# Patient Record
Sex: Female | Born: 1980 | Race: Black or African American | Hispanic: No | Marital: Single | Smoking: Never smoker
Health system: Southern US, Community
[De-identification: ages and names within clinical notes are randomized; demographics above are authoritative.]

---

## 2018-02-11 ENCOUNTER — Emergency Department (HOSPITAL_COMMUNITY): Payer: Self-pay

## 2018-02-11 ENCOUNTER — Observation Stay (HOSPITAL_COMMUNITY)
Admission: EM | Admit: 2018-02-11 | Discharge: 2018-02-12 | Disposition: A | Discharge status: Home / Self Care | Payer: Self-pay | Attending: Orthopaedic Surgery | Admitting: Orthopaedic Surgery

## 2018-02-11 ENCOUNTER — Encounter (HOSPITAL_COMMUNITY): Payer: Self-pay

## 2018-02-11 ENCOUNTER — Other Ambulatory Visit: Payer: Self-pay

## 2018-02-11 ENCOUNTER — Emergency Department (HOSPITAL_COMMUNITY): Payer: Self-pay | Admitting: Certified Registered Nurse Anesthetist

## 2018-02-11 ENCOUNTER — Encounter (HOSPITAL_COMMUNITY)
Admission: EM | Disposition: A | Discharge status: Home / Self Care | Payer: Self-pay | Source: Home / Self Care | Attending: Emergency Medicine

## 2018-02-11 DIAGNOSIS — L089 Local infection of the skin and subcutaneous tissue, unspecified: Secondary | ICD-10-CM

## 2018-02-11 DIAGNOSIS — L03012 Cellulitis of left finger: Secondary | ICD-10-CM | POA: Insufficient documentation

## 2018-02-11 DIAGNOSIS — S61252A Open bite of right middle finger without damage to nail, initial encounter: Secondary | ICD-10-CM | POA: Insufficient documentation

## 2018-02-11 DIAGNOSIS — M6510 Other infective (teno)synovitis, unspecified site: Secondary | ICD-10-CM | POA: Diagnosis present

## 2018-02-11 DIAGNOSIS — L03011 Cellulitis of right finger: Secondary | ICD-10-CM | POA: Insufficient documentation

## 2018-02-11 DIAGNOSIS — M65142 Other infective (teno)synovitis, left hand: Principal | ICD-10-CM | POA: Insufficient documentation

## 2018-02-11 DIAGNOSIS — W503XXA Accidental bite by another person, initial encounter: Secondary | ICD-10-CM | POA: Insufficient documentation

## 2018-02-11 DIAGNOSIS — A499 Bacterial infection, unspecified: Secondary | ICD-10-CM | POA: Diagnosis present

## 2018-02-11 DIAGNOSIS — S61253A Open bite of left middle finger without damage to nail, initial encounter: Secondary | ICD-10-CM | POA: Insufficient documentation

## 2018-02-11 HISTORY — PX: I & D EXTREMITY: SHX5045

## 2018-02-11 LAB — BASIC METABOLIC PANEL
ANION GAP: 10 (ref 5–15)
BUN: 10 mg/dL (ref 6–20)
CALCIUM: 8.8 mg/dL — AB (ref 8.9–10.3)
CO2: 24 mmol/L (ref 22–32)
CREATININE: 0.7 mg/dL (ref 0.44–1.00)
Chloride: 106 mmol/L (ref 98–111)
GFR calc Af Amer: >60 mL/min (ref >60)
GLUCOSE: 107 mg/dL — AB (ref 70–99)
Potassium: 3.4 mmol/L — ABNORMAL LOW (ref 3.5–5.1)
Sodium: 140 mmol/L (ref 135–145)

## 2018-02-11 LAB — CBC WITH DIFFERENTIAL/PLATELET
BASOS PCT: 0 %
Basophils Absolute: 0 10*3/uL (ref 0.0–0.1)
EOS PCT: 1 %
Eosinophils Absolute: 0.1 10*3/uL (ref 0.0–0.7)
HEMATOCRIT: 37.2 % (ref 36.0–46.0)
HEMOGLOBIN: 12.4 g/dL (ref 12.0–15.0)
LYMPHS PCT: 34 %
Lymphs Abs: 3 10*3/uL (ref 0.7–4.0)
MCH: 28.6 pg (ref 26.0–34.0)
MCHC: 33.3 g/dL (ref 30.0–36.0)
MCV: 85.9 fL (ref 78.0–100.0)
MONOS PCT: 5 %
Monocytes Absolute: 0.4 10*3/uL (ref 0.1–1.0)
NEUTROS PCT: 60 %
Neutro Abs: 5.3 10*3/uL (ref 1.7–7.7)
Platelets: 292 10*3/uL (ref 150–400)
RBC: 4.33 MIL/uL (ref 3.87–5.11)
RDW: 12.9 % (ref 11.5–15.5)
WBC: 8.8 10*3/uL (ref 4.0–10.5)

## 2018-02-11 SURGERY — IRRIGATION AND DEBRIDEMENT EXTREMITY
Anesthesia: General | Site: Hand | Laterality: Bilateral

## 2018-02-11 MED ORDER — LIDOCAINE 2% (20 MG/ML) 5 ML SYRINGE
INTRAMUSCULAR | Status: AC
  Filled 2018-02-11: qty 5

## 2018-02-11 MED ORDER — FENTANYL CITRATE (PF) 250 MCG/5ML IJ SOLN
INTRAMUSCULAR | Status: DC | PRN
  Administered 2018-02-11 (×4): 50 ug via INTRAVENOUS

## 2018-02-11 MED ORDER — SODIUM CHLORIDE 0.9 % IV SOLN
INTRAVENOUS | Status: DC
  Administered 2018-02-11 – 2018-02-12 (×2): via INTRAVENOUS

## 2018-02-11 MED ORDER — DIPHENHYDRAMINE HCL 12.5 MG/5ML PO ELIX: 12.5000 mg | ORAL_SOLUTION | ORAL | Status: DC | PRN

## 2018-02-11 MED ORDER — SODIUM CHLORIDE 0.9 % IV SOLN
Freq: Once | INTRAVENOUS | Status: AC
  Administered 2018-02-11: 03:00:00 via INTRAVENOUS

## 2018-02-11 MED ORDER — OXYCODONE HCL 5 MG PO TABS
5.0000 mg | ORAL_TABLET | ORAL | Status: DC | PRN
  Filled 2018-02-11 (×3): qty 2
  Filled 2018-02-11: qty 1

## 2018-02-11 MED ORDER — MORPHINE SULFATE (PF) 4 MG/ML IV SOLN
6.0000 mg | Freq: Once | INTRAVENOUS | Status: AC
  Administered 2018-02-11: 6 mg via INTRAVENOUS
  Filled 2018-02-11: qty 2

## 2018-02-11 MED ORDER — LACTATED RINGERS IV SOLN
INTRAVENOUS | Status: DC | PRN
  Administered 2018-02-11: 09:00:00 via INTRAVENOUS

## 2018-02-11 MED ORDER — ONDANSETRON HCL 4 MG/2ML IJ SOLN
4.0000 mg | Freq: Four times a day (QID) | INTRAMUSCULAR | Status: DC | PRN
  Administered 2018-02-11: 4 mg via INTRAVENOUS
  Filled 2018-02-11: qty 2

## 2018-02-11 MED ORDER — DOCUSATE SODIUM 100 MG PO CAPS
100.0000 mg | ORAL_CAPSULE | Freq: Two times a day (BID) | ORAL | Status: DC
  Administered 2018-02-11 – 2018-02-12 (×2): 100 mg via ORAL
  Filled 2018-02-11 (×2): qty 1

## 2018-02-11 MED ORDER — VANCOMYCIN HCL IN DEXTROSE 1-5 GM/200ML-% IV SOLN
1000.0000 mg | Freq: Two times a day (BID) | INTRAVENOUS | Status: AC
  Administered 2018-02-11: 1000 mg via INTRAVENOUS
  Filled 2018-02-11: qty 200

## 2018-02-11 MED ORDER — METOCLOPRAMIDE HCL 5 MG PO TABS: 5.0000 mg | ORAL_TABLET | Freq: Three times a day (TID) | ORAL | Status: DC | PRN

## 2018-02-11 MED ORDER — METOCLOPRAMIDE HCL 5 MG/ML IJ SOLN: 5.0000 mg | Freq: Three times a day (TID) | INTRAMUSCULAR | Status: DC | PRN

## 2018-02-11 MED ORDER — GLYCOPYRROLATE PF 0.2 MG/ML IJ SOSY
PREFILLED_SYRINGE | INTRAMUSCULAR | Status: AC
  Filled 2018-02-11: qty 1

## 2018-02-11 MED ORDER — SODIUM CHLORIDE 0.9 % IV SOLN
3.0000 g | Freq: Four times a day (QID) | INTRAVENOUS | Status: DC
  Administered 2018-02-11 – 2018-02-12 (×4): 3 g via INTRAVENOUS
  Filled 2018-02-11 (×5): qty 3

## 2018-02-11 MED ORDER — PHENYLEPHRINE 40 MCG/ML (10ML) SYRINGE FOR IV PUSH (FOR BLOOD PRESSURE SUPPORT)
PREFILLED_SYRINGE | INTRAVENOUS | Status: DC | PRN
  Administered 2018-02-11: 80 ug via INTRAVENOUS

## 2018-02-11 MED ORDER — PROPOFOL 10 MG/ML IV BOLUS
INTRAVENOUS | Status: DC | PRN
  Administered 2018-02-11: 150 mg via INTRAVENOUS

## 2018-02-11 MED ORDER — ONDANSETRON HCL 4 MG/2ML IJ SOLN
4.0000 mg | Freq: Once | INTRAMUSCULAR | Status: AC
  Administered 2018-02-11: 4 mg via INTRAVENOUS
  Filled 2018-02-11: qty 2

## 2018-02-11 MED ORDER — VANCOMYCIN HCL 1000 MG IV SOLR
INTRAVENOUS | Status: DC | PRN
  Administered 2018-02-11: 1000 mg via INTRAVENOUS

## 2018-02-11 MED ORDER — DEXAMETHASONE SODIUM PHOSPHATE 10 MG/ML IJ SOLN
INTRAMUSCULAR | Status: DC | PRN
  Administered 2018-02-11: 10 mg via INTRAVENOUS

## 2018-02-11 MED ORDER — GLYCOPYRROLATE PF 0.2 MG/ML IJ SOSY
PREFILLED_SYRINGE | INTRAMUSCULAR | Status: DC | PRN
  Administered 2018-02-11: .1 mg via INTRAVENOUS

## 2018-02-11 MED ORDER — METHOCARBAMOL 500 MG PO TABS
500.0000 mg | ORAL_TABLET | Freq: Four times a day (QID) | ORAL | Status: DC | PRN
  Administered 2018-02-11 – 2018-02-12 (×2): 500 mg via ORAL
  Filled 2018-02-11 (×2): qty 1

## 2018-02-11 MED ORDER — HYDROMORPHONE HCL 1 MG/ML IJ SOLN
0.2500 mg | INTRAMUSCULAR | Status: DC | PRN
  Administered 2018-02-11 (×4): 0.5 mg via INTRAVENOUS

## 2018-02-11 MED ORDER — SUCCINYLCHOLINE CHLORIDE 200 MG/10ML IV SOSY
PREFILLED_SYRINGE | INTRAVENOUS | Status: DC | PRN
  Administered 2018-02-11: 100 mg via INTRAVENOUS

## 2018-02-11 MED ORDER — ONDANSETRON HCL 4 MG/2ML IJ SOLN
INTRAMUSCULAR | Status: DC | PRN
  Administered 2018-02-11: 4 mg via INTRAVENOUS

## 2018-02-11 MED ORDER — HYDROMORPHONE HCL 1 MG/ML IJ SOLN
INTRAMUSCULAR | Status: AC
  Filled 2018-02-11: qty 2

## 2018-02-11 MED ORDER — DEXAMETHASONE SODIUM PHOSPHATE 10 MG/ML IJ SOLN
INTRAMUSCULAR | Status: AC
  Filled 2018-02-11: qty 1

## 2018-02-11 MED ORDER — 0.9 % SODIUM CHLORIDE (POUR BTL) OPTIME
TOPICAL | Status: DC | PRN
  Administered 2018-02-11: 1000 mL

## 2018-02-11 MED ORDER — ONDANSETRON HCL 4 MG PO TABS: 4.0000 mg | ORAL_TABLET | Freq: Four times a day (QID) | ORAL | Status: DC | PRN

## 2018-02-11 MED ORDER — PROPOFOL 10 MG/ML IV BOLUS
INTRAVENOUS | Status: AC
  Filled 2018-02-11: qty 20

## 2018-02-11 MED ORDER — ACETAMINOPHEN 325 MG PO TABS: 325.0000 mg | ORAL_TABLET | Freq: Four times a day (QID) | ORAL | Status: DC | PRN

## 2018-02-11 MED ORDER — FENTANYL CITRATE (PF) 100 MCG/2ML IJ SOLN
INTRAMUSCULAR | Status: AC
  Filled 2018-02-11: qty 2

## 2018-02-11 MED ORDER — SODIUM CHLORIDE 0.9 % IV SOLN
3.0000 g | Freq: Once | INTRAVENOUS | Status: AC
  Administered 2018-02-11: 3 g via INTRAVENOUS
  Filled 2018-02-11: qty 3

## 2018-02-11 MED ORDER — PHENYLEPHRINE 40 MCG/ML (10ML) SYRINGE FOR IV PUSH (FOR BLOOD PRESSURE SUPPORT)
PREFILLED_SYRINGE | INTRAVENOUS | Status: AC
  Filled 2018-02-11: qty 10

## 2018-02-11 MED ORDER — PROMETHAZINE HCL 25 MG/ML IJ SOLN: 6.2500 mg | INTRAMUSCULAR | Status: DC | PRN

## 2018-02-11 MED ORDER — HYDROMORPHONE HCL 1 MG/ML IJ SOLN
0.5000 mg | INTRAMUSCULAR | Status: DC | PRN
  Administered 2018-02-11: 0.5 mg via INTRAVENOUS
  Filled 2018-02-11: qty 1

## 2018-02-11 MED ORDER — OXYCODONE HCL 5 MG PO TABS
10.0000 mg | ORAL_TABLET | ORAL | Status: DC | PRN
  Administered 2018-02-11: 10 mg via ORAL
  Administered 2018-02-11: 15 mg via ORAL
  Administered 2018-02-11: 10 mg via ORAL
  Administered 2018-02-12 (×2): 15 mg via ORAL
  Filled 2018-02-11 (×2): qty 3

## 2018-02-11 MED ORDER — METHOCARBAMOL 1000 MG/10ML IJ SOLN
500.0000 mg | Freq: Four times a day (QID) | INTRAVENOUS | Status: DC | PRN
  Filled 2018-02-11: qty 5

## 2018-02-11 MED ORDER — ONDANSETRON HCL 4 MG/2ML IJ SOLN
INTRAMUSCULAR | Status: AC
  Filled 2018-02-11: qty 2

## 2018-02-11 MED ORDER — ROCURONIUM BROMIDE 100 MG/10ML IV SOLN
INTRAVENOUS | Status: AC
  Filled 2018-02-11: qty 1

## 2018-02-11 MED ORDER — LIDOCAINE 2% (20 MG/ML) 5 ML SYRINGE
INTRAMUSCULAR | Status: DC | PRN
  Administered 2018-02-11: 60 mg via INTRAVENOUS

## 2018-02-11 MED ORDER — VANCOMYCIN HCL IN DEXTROSE 1-5 GM/200ML-% IV SOLN
INTRAVENOUS | Status: AC
  Filled 2018-02-11: qty 200

## 2018-02-11 SURGICAL SUPPLY — 32 items
BAG ZIPLOCK 12X15 (MISCELLANEOUS) ×3 IMPLANT
BANDAGE ESMARK 6X9 LF (GAUZE/BANDAGES/DRESSINGS) IMPLANT
BNDG COHESIVE 1X5 TAN STRL LF (GAUZE/BANDAGES/DRESSINGS) ×6 IMPLANT
BNDG CONFORM 2 STRL LF (GAUZE/BANDAGES/DRESSINGS) ×6 IMPLANT
BNDG ESMARK 6X9 LF (GAUZE/BANDAGES/DRESSINGS)
BNDG GAUZE ELAST 4 BULKY (GAUZE/BANDAGES/DRESSINGS) IMPLANT
COVER SURGICAL LIGHT HANDLE (MISCELLANEOUS) ×3 IMPLANT
CUFF TOURN SGL QUICK 18 (TOURNIQUET CUFF) IMPLANT
CUFF TOURN SGL QUICK 24 (TOURNIQUET CUFF)
CUFF TOURN SGL QUICK 34 (TOURNIQUET CUFF)
CUFF TRNQT CYL 24X4X40X1 (TOURNIQUET CUFF) IMPLANT
CUFF TRNQT CYL 34X4X40X1 (TOURNIQUET CUFF) IMPLANT
DRAIN PENROSE 18X1/2 LTX STRL (DRAIN) IMPLANT
DRSG PAD ABDOMINAL 8X10 ST (GAUZE/BANDAGES/DRESSINGS) IMPLANT
DURAPREP 26ML APPLICATOR (WOUND CARE) ×6 IMPLANT
ELECT REM PT RETURN 15FT ADLT (MISCELLANEOUS) ×3 IMPLANT
GAUZE SPONGE 4X4 12PLY STRL (GAUZE/BANDAGES/DRESSINGS) ×3 IMPLANT
GAUZE XEROFORM 1X8 LF (GAUZE/BANDAGES/DRESSINGS) ×6 IMPLANT
GLOVE BIO SURGEON STRL SZ7.5 (GLOVE) ×3 IMPLANT
GLOVE BIOGEL PI IND STRL 8 (GLOVE) ×1 IMPLANT
GLOVE BIOGEL PI INDICATOR 8 (GLOVE) ×2
GLOVE ECLIPSE 8.0 STRL XLNG CF (GLOVE) ×3 IMPLANT
GOWN STRL REUS W/TWL XL LVL3 (GOWN DISPOSABLE) ×3 IMPLANT
HANDPIECE INTERPULSE COAX TIP (DISPOSABLE) ×2
KIT BASIN OR (CUSTOM PROCEDURE TRAY) IMPLANT
PACK ORTHO EXTREMITY (CUSTOM PROCEDURE TRAY) ×3 IMPLANT
PAD CAST 4YDX4 CTTN HI CHSV (CAST SUPPLIES) IMPLANT
PADDING CAST COTTON 4X4 STRL (CAST SUPPLIES)
POSITIONER SURGICAL ARM (MISCELLANEOUS) ×3 IMPLANT
SET HNDPC FAN SPRY TIP SCT (DISPOSABLE) ×1 IMPLANT
SYR CONTROL 10ML LL (SYRINGE) IMPLANT
TOWEL OR 17X26 10 PK STRL BLUE (TOWEL DISPOSABLE) ×6 IMPLANT
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
## 2018-02-11 NOTE — ED Notes (Addendum)
Spoke to OR. They are ready for patient. Patient transported to PACU. 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 Patient wiped down with Hcg wipes. OR checklist completed.
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
## 2018-02-11 NOTE — Anesthesia Procedure Notes (Signed)
Procedure Name: Intubation Date/Time: 02/11/2018 9:30 AM Performed by: Lorelee Market, CRNA Pre-anesthesia Checklist: Patient identified, Emergency Drugs available, Suction available, Patient being monitored and Timeout performed Patient Re-evaluated:Patient Re-evaluated prior to induction Oxygen Delivery Method: Circle system utilized Preoxygenation: Pre-oxygenation with 100% oxygen Induction Type: IV induction, Rapid sequence and Cricoid Pressure applied Grade View: Grade I Tube type: Oral Tube size: 7.0 mm Number of attempts: 1 Airway Equipment and Method: Stylet Placement Confirmation: ETT inserted through vocal cords under direct vision,  positive ETCO2 and breath sounds checked- equal and bilateral Secured at: 22 cm Tube secured with: Tape Dental Injury: Teeth and Oropharynx as per pre-operative assessment
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
## 2018-02-11 NOTE — Brief Op Note (Signed)
02/11/2018  10:09 AM  PATIENT:  Marcia Soto  37 y.o. female  PRE-OPERATIVE DIAGNOSIS:  INFECTION BILATERAL MIDDLE FINGERS  POST-OPERATIVE DIAGNOSIS:  infected bilateral  middle fingers  PROCEDURE:  Procedure(s): IRRIGATION AND DEBRIDEMENT BILATERAL /MIDDLE FINGERS (Bilateral)  SURGEON:  Surgeon(s) and Role:    Kathryne Hitch, MD - Primary  ANESTHESIA:   general  EBL:  5 mL   COUNTS:  YES  TOURNIQUET:  * Missing tourniquet times found for documented tourniquets in log: [id number] *  DICTATION: .Other Dictation: Dictation Number [id number]  PLAN OF CARE: Admit for overnight observation  PATIENT DISPOSITION:  PACU - hemodynamically stable.   Delay start of Pharmacological VTE agent (>24hrs) due to surgical blood loss or risk of bleeding: no

## 2018-02-11 NOTE — Transfer of Care (Signed)
Immediate Anesthesia Transfer of Care Note  Patient: Marcia Soto  Procedure(s) Performed: IRRIGATION AND DEBRIDEMENT BILATERAL /MIDDLE FINGERS (Bilateral Hand)  Patient Location: PACU  Anesthesia Type:General  Level of Consciousness: awake, alert , oriented and patient cooperative  Airway & Oxygen Therapy: Patient Spontanous Breathing and Patient connected to face mask oxygen  Post-op Assessment: Report given to RN, Post -op Vital signs reviewed and stable and Patient moving all extremities  Post vital signs: Reviewed and stable  Last Vitals:  Vitals Value Taken Time  BP 145/100 02/11/2018 10:15 AM  Temp    Pulse 83 02/11/2018 10:17 AM  Resp 5 02/11/2018 10:17 AM  SpO2 100 % 02/11/2018 10:17 AM  Vitals shown include unvalidated device data.  Last Pain:  Vitals:   02/11/18 0741  TempSrc:   PainSc: 0-No pain         Complications: No apparent anesthesia complications

## 2018-02-11 NOTE — Anesthesia Postprocedure Evaluation (Signed)
Anesthesia Post Note  Patient: Marcia Soto  Procedure(s) Performed: IRRIGATION AND DEBRIDEMENT BILATERAL /MIDDLE FINGERS (Bilateral Hand)     Patient location during evaluation: PACU Anesthesia Type: General Level of consciousness: awake and alert Pain management: pain level controlled Vital Signs Assessment: post-procedure vital signs reviewed and stable Respiratory status: spontaneous breathing, nonlabored ventilation, respiratory function stable and patient connected to nasal cannula oxygen Cardiovascular status: blood pressure returned to baseline and stable Postop Assessment: no apparent nausea or vomiting Anesthetic complications: no    Last Vitals:  Vitals:   02/11/18 1045 02/11/18 1113  BP: 129/90 132/90  Pulse: 86 66  Resp: 12 16  Temp: 36.7 C 36.4 C  SpO2: 94% 100%    Last Pain:  Vitals:   02/11/18 1113  TempSrc: Oral  PainSc:                  Kennieth Rad
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
## 2018-02-12 ENCOUNTER — Encounter (HOSPITAL_COMMUNITY): Payer: Self-pay | Admitting: Orthopaedic Surgery

## 2018-02-12 MED ORDER — DOXYCYCLINE HYCLATE 100 MG PO TABS: 100.0000 mg | ORAL_TABLET | Freq: Two times a day (BID) | ORAL | 0 refills | Status: DC

## 2018-02-12 MED ORDER — OXYCODONE HCL 5 MG PO TABS: 5.0000 mg | ORAL_TABLET | ORAL | 0 refills | Status: DC | PRN

## 2018-02-12 NOTE — Discharge Instructions (Signed)
Leave your current dressings on and in place for the next 2 days. Keep your dressings clean and dry. You can remove your dressings in 2 days (Thursday). In 2 days, you can place a small amount of Neosporin on your wounds daily and band-aids. Will need to be out of work thru next week.

## 2018-02-12 NOTE — Progress Notes (Signed)
Discharge paperwork discussed with pt at the bedside.  She demonstrated understanding.  Pt escorted by wheelchair in stable condition to main lobby.
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
## 2018-02-12 NOTE — Plan of Care (Signed)
Pt alert and oriented, complaints of pain in fingers this am.  Plan to d/c home per MD order.

## 2018-02-12 NOTE — Progress Notes (Signed)
Patient ID: Marcia Soto, female   DOB: 1981/02/13, 37 y.o.   MRN: [id number] Tolerated surgery well yesterday.  Feels better overall, but left middle finger still painful.  Dressings clean and intact.  Can be discharged to home today on oral antibiotics.

## 2018-02-27 ENCOUNTER — Encounter (INDEPENDENT_AMBULATORY_CARE_PROVIDER_SITE_OTHER): Payer: Self-pay | Admitting: Physician Assistant

## 2018-02-27 ENCOUNTER — Ambulatory Visit (INDEPENDENT_AMBULATORY_CARE_PROVIDER_SITE_OTHER): Payer: Self-pay | Admitting: Physician Assistant

## 2018-02-27 DIAGNOSIS — W503XXD Accidental bite by another person, subsequent encounter: Secondary | ICD-10-CM

## 2018-02-27 DIAGNOSIS — M6510 Other infective (teno)synovitis, unspecified site: Secondary | ICD-10-CM
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
## 2018-03-13 ENCOUNTER — Ambulatory Visit (INDEPENDENT_AMBULATORY_CARE_PROVIDER_SITE_OTHER): Payer: Self-pay | Admitting: Physician Assistant

## 2018-03-13 ENCOUNTER — Encounter (INDEPENDENT_AMBULATORY_CARE_PROVIDER_SITE_OTHER): Payer: Self-pay | Admitting: Physician Assistant

## 2018-03-13 VITALS — Ht 60.0 in | Wt 147.0 lb

## 2018-03-13 DIAGNOSIS — M6510 Other infective (teno)synovitis, unspecified site: Secondary | ICD-10-CM

## 2018-03-13 DIAGNOSIS — W503XXD Accidental bite by another person, subsequent encounter: Secondary | ICD-10-CM

## 2018-03-13 NOTE — Progress Notes (Signed)
HPI: Marcia Soto returns today follow-up of her both long finger infections which she underwent I&D's for on 02/11/2018.  She states the right hand is doing well.  The left hand long finger she still has stiffness soreness and some swelling.  She has had no fevers chills.  She is working on range of motion on her own.  Physical exam: Right hand middle finger surgical incision sites healing well no signs of infection she has full range of motion of the right long finger.  Left long finger she has decreased motion through the PIP joint and swelling about the PIP joint.  Surgical incision is healed well.  No signs of gross infection.  Positive edema of the left long finger but no erythema.  Impression: Status post irrigation debridement bilateral middle finger infections  Plan: Patient would like to be referred to therapy for range of motion of the left hand particularly.  She is still working on getting some type of insurance coverage.  She is given Pennsaid samples that she will apply to the left middle finger 3 times daily.  Follow-up with [place] in 1 month.

## 2018-04-15 ENCOUNTER — Ambulatory Visit (INDEPENDENT_AMBULATORY_CARE_PROVIDER_SITE_OTHER): Payer: Self-pay | Admitting: Physician Assistant

## 2018-07-18 ENCOUNTER — Emergency Department (HOSPITAL_COMMUNITY): Payer: Self-pay

## 2018-07-18 ENCOUNTER — Emergency Department (HOSPITAL_COMMUNITY)
Admission: EM | Admit: 2018-07-18 | Discharge: 2018-07-18 | Disposition: A | Discharge status: Home / Self Care | Payer: Self-pay | Attending: Emergency Medicine | Admitting: Emergency Medicine

## 2018-07-18 ENCOUNTER — Encounter (HOSPITAL_COMMUNITY): Payer: Self-pay | Admitting: Emergency Medicine

## 2018-07-18 DIAGNOSIS — M545 Low back pain, unspecified: Secondary | ICD-10-CM

## 2018-07-18 DIAGNOSIS — R300 Dysuria: Secondary | ICD-10-CM | POA: Insufficient documentation

## 2018-07-18 LAB — URINALYSIS, ROUTINE W REFLEX MICROSCOPIC
Bilirubin Urine: NEGATIVE
Glucose, UA: NEGATIVE mg/dL
HGB URINE DIPSTICK: NEGATIVE
Ketones, ur: NEGATIVE mg/dL
Leukocytes, UA: NEGATIVE
Nitrite: NEGATIVE
Protein, ur: NEGATIVE mg/dL
Specific Gravity, Urine: 1.035 — ABNORMAL HIGH (ref 1.005–1.030)
pH: 5 (ref 5.0–8.0)

## 2018-07-18 MED ORDER — METHOCARBAMOL 500 MG PO TABS: 500.0000 mg | ORAL_TABLET | Freq: Two times a day (BID) | ORAL | 0 refills | Status: AC

## 2018-07-18 MED ORDER — PREDNISONE 20 MG PO TABS: 40.0000 mg | ORAL_TABLET | Freq: Every day | ORAL | 0 refills | Status: AC

## 2018-07-18 NOTE — Discharge Instructions (Signed)
I have prescribed muscle relaxers for your pain, please do not drink or drive while taking this medications as they can make you drowsy.  I have also prescribed steroids, be advised this medication can cause insomnia, appetite changes.  These follow-up with PCP in 1 week for reevaluation of your symptoms.  You experience any bowel or bladder incontinence, fever, worsening in your symptoms please return to the ED. ° °
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
## 2018-07-18 NOTE — ED Notes (Signed)
Pt verbalized understanding of discharge paperwork, prescriptions and follow-up care 

## 2018-07-18 NOTE — ED Triage Notes (Signed)
Pt reports having lower right sided back pain that radiates across the mid back x1 week. Denies any urinary symptoms.

## 2018-07-19 LAB — URINE CULTURE: Culture: NO GROWTH

## 2018-08-14 ENCOUNTER — Encounter (HOSPITAL_COMMUNITY): Payer: Self-pay | Admitting: *Deleted

## 2018-08-14 ENCOUNTER — Emergency Department (HOSPITAL_COMMUNITY): Payer: Medicaid Other

## 2018-08-14 ENCOUNTER — Emergency Department (HOSPITAL_COMMUNITY)
Admission: EM | Admit: 2018-08-14 | Discharge: 2018-08-14 | Disposition: A | Discharge status: Home / Self Care | Payer: Medicaid Other | Attending: Emergency Medicine | Admitting: Emergency Medicine

## 2018-08-14 ENCOUNTER — Other Ambulatory Visit: Payer: Self-pay

## 2018-08-14 DIAGNOSIS — R05 Cough: Secondary | ICD-10-CM

## 2018-08-14 DIAGNOSIS — J101 Influenza due to other identified influenza virus with other respiratory manifestations: Secondary | ICD-10-CM

## 2018-08-14 DIAGNOSIS — J111 Influenza due to unidentified influenza virus with other respiratory manifestations: Secondary | ICD-10-CM | POA: Insufficient documentation

## 2018-08-14 DIAGNOSIS — Z79899 Other long term (current) drug therapy: Secondary | ICD-10-CM | POA: Insufficient documentation

## 2018-08-14 DIAGNOSIS — R059 Cough, unspecified: Secondary | ICD-10-CM

## 2018-08-14 LAB — CBC WITH DIFFERENTIAL/PLATELET
Abs Immature Granulocytes: 0.02 10*3/uL (ref 0.00–0.07)
Basophils Absolute: 0 10*3/uL (ref 0.0–0.1)
Basophils Relative: 0 %
EOS PCT: 0 %
Eosinophils Absolute: 0 10*3/uL (ref 0.0–0.5)
HCT: 39.9 % (ref 36.0–46.0)
Hemoglobin: 12.6 g/dL (ref 12.0–15.0)
Immature Granulocytes: 0 %
LYMPHS ABS: 0.6 10*3/uL — AB (ref 0.7–4.0)
LYMPHS PCT: 7 %
MCH: 27.6 pg (ref 26.0–34.0)
MCHC: 31.6 g/dL (ref 30.0–36.0)
MCV: 87.3 fL (ref 80.0–100.0)
Monocytes Absolute: 0.4 10*3/uL (ref 0.1–1.0)
Monocytes Relative: 5 %
Neutro Abs: 6.9 10*3/uL (ref 1.7–7.7)
Neutrophils Relative %: 88 %
Platelets: 284 10*3/uL (ref 150–400)
RBC: 4.57 MIL/uL (ref 3.87–5.11)
RDW: 12.7 % (ref 11.5–15.5)
WBC: 8 10*3/uL (ref 4.0–10.5)
nRBC: 0 % (ref 0.0–0.2)

## 2018-08-14 LAB — BASIC METABOLIC PANEL
Anion gap: 9 (ref 5–15)
BUN: 10 mg/dL (ref 6–20)
CO2: 23 mmol/L (ref 22–32)
CREATININE: 0.81 mg/dL (ref 0.44–1.00)
Calcium: 9.2 mg/dL (ref 8.9–10.3)
Chloride: 104 mmol/L (ref 98–111)
GFR calc non Af Amer: >60 mL/min (ref >60)
Glucose, Bld: 107 mg/dL — ABNORMAL HIGH (ref 70–99)
Potassium: 3.6 mmol/L (ref 3.5–5.1)
Sodium: 136 mmol/L (ref 135–145)

## 2018-08-14 LAB — INFLUENZA PANEL BY PCR (TYPE A & B)
Influenza A By PCR: POSITIVE — AB
Influenza B By PCR: NEGATIVE

## 2018-08-14 MED ORDER — IBUPROFEN 400 MG PO TABS: 600.0000 mg | ORAL_TABLET | Freq: Once | ORAL | Status: DC

## 2018-08-14 MED ORDER — OSELTAMIVIR PHOSPHATE 75 MG PO CAPS: 75.0000 mg | ORAL_CAPSULE | Freq: Two times a day (BID) | ORAL | 0 refills | Status: AC

## 2018-08-14 MED ORDER — SODIUM CHLORIDE 0.9 % IV BOLUS
2000.0000 mL | Freq: Once | INTRAVENOUS | Status: AC
  Administered 2018-08-14: 2000 mL via INTRAVENOUS

## 2018-08-14 MED ORDER — ACETAMINOPHEN 325 MG PO TABS
650.0000 mg | ORAL_TABLET | Freq: Once | ORAL | Status: AC
  Administered 2018-08-14: 650 mg via ORAL
  Filled 2018-08-14: qty 2

## 2018-08-14 MED ORDER — KETOROLAC TROMETHAMINE 30 MG/ML IJ SOLN
30.0000 mg | Freq: Once | INTRAMUSCULAR | Status: AC
  Administered 2018-08-14: 30 mg via INTRAVENOUS
  Filled 2018-08-14: qty 1

## 2018-08-14 NOTE — Discharge Instructions (Addendum)
Please take medication as prescribed for 5 days; this should lessen your symptoms by up to 1 day Continue taking Ibuprofen and Tylenol for symptom relief and fever reducer  Drink plenty of fluids to stay hydrated  Follow up with your PCP in 1 week once you feel better Return to the ED for any worsening symptoms, fever, chills, dizziness, lightheadedness.

## 2018-08-14 NOTE — ED Notes (Signed)
Patient verbalizes understanding of discharge instructions. Opportunity for questioning and answers were provided. Armband removed by staff, pt discharged from ED.  

## 2018-08-14 NOTE — ED Triage Notes (Signed)
Pt in reports vomiting x 3-4 times in the last 24 hours, pt reports fever & chills, sore throat, cough, body aches, p t HR 140 in triage, pt ambulatory, A&O x4
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
## 2020-01-19 ENCOUNTER — Other Ambulatory Visit: Payer: Self-pay

## 2020-01-19 ENCOUNTER — Encounter (HOSPITAL_COMMUNITY): Payer: Self-pay | Admitting: Emergency Medicine

## 2020-01-19 ENCOUNTER — Emergency Department (HOSPITAL_COMMUNITY): Payer: BC Managed Care – PPO

## 2020-01-19 ENCOUNTER — Emergency Department (HOSPITAL_COMMUNITY)
Admission: EM | Admit: 2020-01-19 | Discharge: 2020-01-19 | Disposition: A | Discharge status: Home / Self Care | Payer: BC Managed Care – PPO | Attending: Emergency Medicine | Admitting: Emergency Medicine

## 2020-01-19 DIAGNOSIS — M79644 Pain in right finger(s): Secondary | ICD-10-CM | POA: Diagnosis present

## 2020-01-19 DIAGNOSIS — S60942A Unspecified superficial injury of right middle finger, initial encounter: Secondary | ICD-10-CM | POA: Insufficient documentation

## 2020-01-19 DIAGNOSIS — Y990 Civilian activity done for income or pay: Secondary | ICD-10-CM | POA: Diagnosis not present

## 2020-01-19 DIAGNOSIS — Y9389 Activity, other specified: Secondary | ICD-10-CM | POA: Diagnosis not present

## 2020-01-19 DIAGNOSIS — Y9289 Other specified places as the place of occurrence of the external cause: Secondary | ICD-10-CM | POA: Insufficient documentation

## 2020-01-19 DIAGNOSIS — S6991XA Unspecified injury of right wrist, hand and finger(s), initial encounter: Secondary | ICD-10-CM

## 2020-01-19 DIAGNOSIS — Y999 Unspecified external cause status: Secondary | ICD-10-CM | POA: Insufficient documentation

## 2020-01-19 DIAGNOSIS — W499XXA Exposure to other inanimate mechanical forces, initial encounter: Secondary | ICD-10-CM | POA: Diagnosis not present

## 2020-01-19 MED ORDER — AMOXICILLIN-POT CLAVULANATE 875-125 MG PO TABS
1.0000 | ORAL_TABLET | Freq: Once | ORAL | Status: AC
  Administered 2020-01-19: 1 via ORAL
  Filled 2020-01-19: qty 1

## 2020-01-19 MED ORDER — NAPROXEN 500 MG PO TABS: 500.0000 mg | ORAL_TABLET | Freq: Two times a day (BID) | ORAL | 0 refills | Status: AC

## 2020-01-19 MED ORDER — AMOXICILLIN-POT CLAVULANATE 875-125 MG PO TABS: 1.0000 | ORAL_TABLET | Freq: Two times a day (BID) | ORAL | 0 refills | Status: AC

## 2020-01-19 NOTE — ED Triage Notes (Signed)
Patient reports injury to right middle finger 4 days ago presents with redness/swelling , no drainage .

## 2020-01-19 NOTE — Progress Notes (Signed)
Orthopedic Tech Progress Note Patient Details:  Marcia Soto 1981/01/09 [id number]  Ortho Devices Type of Ortho Device: Finger splint Ortho Device/Splint Location: RUE Ortho Device/Splint Interventions: Ordered, Application   Post Interventions Patient Tolerated: Well Instructions Provided: Care of device   Donald Pore 01/19/2020, 9:44 AM
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
## 2020-01-19 NOTE — ED Notes (Signed)
Called ortho tech 

## 2020-01-19 NOTE — Discharge Instructions (Signed)
As discussed, your pain is likely due to inflammation of the finger in the area your injured a few days ago.  However, it is important you monitor your condition carefully, and do not hesitate to return here.  Otherwise, please follow-up with your physician.

## 2020-01-26 ENCOUNTER — Encounter: Payer: Self-pay | Admitting: Orthopaedic Surgery

## 2020-01-26 ENCOUNTER — Ambulatory Visit (INDEPENDENT_AMBULATORY_CARE_PROVIDER_SITE_OTHER): Payer: BC Managed Care – PPO | Admitting: Orthopaedic Surgery

## 2020-01-26 DIAGNOSIS — M6510 Other infective (teno)synovitis, unspecified site: Secondary | ICD-10-CM
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
## 2020-04-10 ENCOUNTER — Emergency Department (HOSPITAL_BASED_OUTPATIENT_CLINIC_OR_DEPARTMENT_OTHER)
Admission: EM | Admit: 2020-04-10 | Discharge: 2020-04-11 | Disposition: A | Discharge status: Home / Self Care | Payer: BC Managed Care – PPO | Attending: Emergency Medicine | Admitting: Emergency Medicine

## 2020-04-10 ENCOUNTER — Encounter (HOSPITAL_BASED_OUTPATIENT_CLINIC_OR_DEPARTMENT_OTHER): Payer: Self-pay | Admitting: Emergency Medicine

## 2020-04-10 ENCOUNTER — Other Ambulatory Visit: Payer: Self-pay

## 2020-04-10 DIAGNOSIS — S60032A Contusion of left middle finger without damage to nail, initial encounter: Secondary | ICD-10-CM | POA: Diagnosis not present

## 2020-04-10 DIAGNOSIS — R58 Hemorrhage, not elsewhere classified: Secondary | ICD-10-CM

## 2020-04-10 DIAGNOSIS — S0083XA Contusion of other part of head, initial encounter: Secondary | ICD-10-CM | POA: Diagnosis not present
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
## 2020-04-11 ENCOUNTER — Emergency Department (HOSPITAL_BASED_OUTPATIENT_CLINIC_OR_DEPARTMENT_OTHER): Payer: BC Managed Care – PPO

## 2020-04-11 ENCOUNTER — Encounter (HOSPITAL_BASED_OUTPATIENT_CLINIC_OR_DEPARTMENT_OTHER): Payer: Self-pay | Admitting: Emergency Medicine

## 2020-04-11 MED ORDER — ACETAMINOPHEN 500 MG PO TABS
1000.0000 mg | ORAL_TABLET | Freq: Once | ORAL | Status: AC
  Administered 2020-04-11: 1000 mg via ORAL
  Filled 2020-04-11: qty 2

## 2020-04-11 MED ORDER — IBUPROFEN 800 MG PO TABS
800.0000 mg | ORAL_TABLET | Freq: Once | ORAL | Status: AC
  Administered 2020-04-11: 800 mg via ORAL
  Filled 2020-04-11: qty 1
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
## 2020-11-20 ENCOUNTER — Encounter (HOSPITAL_COMMUNITY): Payer: Self-pay | Admitting: *Deleted

## 2020-11-20 ENCOUNTER — Other Ambulatory Visit: Payer: Self-pay

## 2020-11-20 ENCOUNTER — Ambulatory Visit (HOSPITAL_COMMUNITY)
Admission: EM | Admit: 2020-11-20 | Discharge: 2020-11-20 | Disposition: A | Discharge status: Home / Self Care | Payer: BC Managed Care – PPO | Attending: Emergency Medicine | Admitting: Emergency Medicine

## 2020-11-20 DIAGNOSIS — R051 Acute cough: Secondary | ICD-10-CM | POA: Diagnosis present

## 2020-11-20 DIAGNOSIS — J069 Acute upper respiratory infection, unspecified: Secondary | ICD-10-CM | POA: Diagnosis not present

## 2020-11-20 DIAGNOSIS — Z793 Long term (current) use of hormonal contraceptives: Secondary | ICD-10-CM | POA: Diagnosis not present

## 2020-11-20 DIAGNOSIS — U071 COVID-19: Secondary | ICD-10-CM | POA: Insufficient documentation

## 2020-11-20 LAB — SARS CORONAVIRUS 2 (TAT 6-24 HRS): SARS Coronavirus 2: POSITIVE — AB

## 2020-11-20 MED ORDER — PROMETHAZINE-DM 6.25-15 MG/5ML PO SYRP: 5.0000 mL | ORAL_SOLUTION | Freq: Four times a day (QID) | ORAL | 0 refills | Status: DC | PRN

## 2020-11-20 MED ORDER — BENZONATATE 100 MG PO CAPS: 200.0000 mg | ORAL_CAPSULE | Freq: Three times a day (TID) | ORAL | 0 refills | Status: DC | PRN
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
## 2020-11-20 NOTE — Discharge Instructions (Addendum)
We will contact you if your COVID test is positive.  Please quarantine while waiting on those results.   You can take Tylenol and/or Ibuprofen as needed for fever reduction and pain relief.   You can take the [facility] as needed for cough.  You can take the Promethazine DM at needed for cough at night.    For cough: honey 1/2 to 1 teaspoon (you can dilute the honey in water or another fluid).  You can also use guaifenesin and dextromethorphan for cough. You can use a humidifier for chest congestion and cough.  If you don't have a humidifier, you can sit in the bathroom with the hot shower running.     For sore throat: try warm salt water gargles, cepacol lozenges, throat spray, warm tea or water with lemon/honey, popsicles or ice, or OTC cold relief medicine for throat discomfort.    For congestion: take a daily anti-histamine like Zyrtec, Claritin, and a oral decongestant, such as pseudoephedrine.  You can also use Flonase 1-2 sprays in each nostril daily.    It is important to stay hydrated: drink plenty of fluids (water, gatorade/powerade/pedialyte, juices, or teas) to keep your throat moisturized and help further relieve irritation/discomfort.   Return or go to the Emergency Department if symptoms worsen or do not improve in the next few days.

## 2020-11-20 NOTE — ED Triage Notes (Signed)
Pt reports the cough started Tuesday morning.

## 2021-09-09 ENCOUNTER — Ambulatory Visit
Admission: EM | Admit: 2021-09-09 | Discharge: 2021-09-09 | Disposition: A | Discharge status: Home / Self Care | Payer: BC Managed Care – PPO | Attending: Physician Assistant | Admitting: Physician Assistant

## 2021-09-09 DIAGNOSIS — J209 Acute bronchitis, unspecified: Secondary | ICD-10-CM

## 2021-09-09 MED ORDER — PREDNISONE 20 MG PO TABS: 40.0000 mg | ORAL_TABLET | Freq: Every day | ORAL | 0 refills | Status: AC

## 2021-09-09 MED ORDER — AZITHROMYCIN 250 MG PO TABS: 250.0000 mg | ORAL_TABLET | Freq: Every day | ORAL | 0 refills | Status: DC

## 2021-09-09 NOTE — ED Triage Notes (Signed)
Pt c/o cough, sore throat,  ? ?Denies nasal congestion, ear ache, headache, nausea, vomiting, diarrhea, constipation  ? ?Onset ~ Saturday  ?
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
## 2022-07-24 ENCOUNTER — Ambulatory Visit
Admission: EM | Admit: 2022-07-24 | Discharge: 2022-07-24 | Disposition: A | Discharge status: Home / Self Care | Payer: BC Managed Care – PPO | Attending: Family Medicine | Admitting: Family Medicine

## 2022-07-24 DIAGNOSIS — J069 Acute upper respiratory infection, unspecified: Secondary | ICD-10-CM | POA: Insufficient documentation

## 2022-07-24 DIAGNOSIS — Z1152 Encounter for screening for COVID-19: Secondary | ICD-10-CM | POA: Insufficient documentation

## 2022-07-24 DIAGNOSIS — R52 Pain, unspecified: Secondary | ICD-10-CM | POA: Insufficient documentation

## 2022-07-24 DIAGNOSIS — R051 Acute cough: Secondary | ICD-10-CM | POA: Insufficient documentation

## 2022-07-24 LAB — POCT INFLUENZA A/B
Influenza A, POC: NEGATIVE
Influenza B, POC: NEGATIVE

## 2022-07-24 MED ORDER — BENZONATATE 200 MG PO CAPS
200.0000 mg | ORAL_CAPSULE | Freq: Three times a day (TID) | ORAL | 0 refills | Status: AC
Start: 2022-07-24 — End: 2022-07-31

## 2022-07-24 MED ORDER — ALBUTEROL SULFATE HFA 108 (90 BASE) MCG/ACT IN AERS: 2.0000 | INHALATION_SPRAY | RESPIRATORY_TRACT | 0 refills | Status: DC | PRN

## 2022-07-24 NOTE — ED Triage Notes (Signed)
Pt presents with fatigue, chills, nausea, vomiting, and diarrhea X 2 days.

## 2022-07-24 NOTE — Discharge Instructions (Addendum)
You were seen today for upper respiratory symptoms.  Your flu swab was negative.  You were swabbed for covid today and this will be resulted tomorrow.  If positive we could send out an antiviral.  In the mean time I recommend you get plenty of rest and fluids.  I have sent out an inhaler and tessalon to help with your symptoms.  You should continue tylenol/motrin for body aches pain or fever as well.  If you have worsening cough or shortness of breath then please go to the ER for further evaluation.
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
## 2022-07-25 ENCOUNTER — Telehealth (HOSPITAL_COMMUNITY): Payer: Self-pay | Admitting: Emergency Medicine

## 2022-07-25 LAB — SARS CORONAVIRUS 2 (TAT 6-24 HRS): SARS Coronavirus 2: POSITIVE — AB

## 2022-07-25 MED ORDER — NIRMATRELVIR/RITONAVIR (PAXLOVID)TABLET: 3.0000 | ORAL_TABLET | Freq: Two times a day (BID) | ORAL | 0 refills | Status: AC

## 2022-11-01 ENCOUNTER — Observation Stay (HOSPITAL_BASED_OUTPATIENT_CLINIC_OR_DEPARTMENT_OTHER)
Admission: EM | Admit: 2022-11-01 | Discharge: 2022-11-02 | Disposition: A | Discharge status: Home / Self Care | Payer: Self-pay | Attending: Student | Admitting: Student

## 2022-11-01 ENCOUNTER — Emergency Department (HOSPITAL_BASED_OUTPATIENT_CLINIC_OR_DEPARTMENT_OTHER): Payer: Self-pay

## 2022-11-01 ENCOUNTER — Encounter (HOSPITAL_BASED_OUTPATIENT_CLINIC_OR_DEPARTMENT_OTHER): Payer: Self-pay

## 2022-11-01 ENCOUNTER — Other Ambulatory Visit: Payer: Self-pay

## 2022-11-01 DIAGNOSIS — Z79899 Other long term (current) drug therapy: Secondary | ICD-10-CM | POA: Insufficient documentation

## 2022-11-01 DIAGNOSIS — F1290 Cannabis use, unspecified, uncomplicated: Secondary | ICD-10-CM | POA: Insufficient documentation

## 2022-11-01 DIAGNOSIS — Z7902 Long term (current) use of antithrombotics/antiplatelets: Secondary | ICD-10-CM | POA: Insufficient documentation

## 2022-11-01 DIAGNOSIS — E876 Hypokalemia: Secondary | ICD-10-CM | POA: Insufficient documentation

## 2022-11-01 DIAGNOSIS — G459 Transient cerebral ischemic attack, unspecified: Principal | ICD-10-CM | POA: Insufficient documentation

## 2022-11-01 DIAGNOSIS — Z7982 Long term (current) use of aspirin: Secondary | ICD-10-CM | POA: Insufficient documentation

## 2022-11-01 DIAGNOSIS — R404 Transient alteration of awareness: Secondary | ICD-10-CM | POA: Insufficient documentation

## 2022-11-01 DIAGNOSIS — R519 Headache, unspecified: Secondary | ICD-10-CM

## 2022-11-01 DIAGNOSIS — A599 Trichomoniasis, unspecified: Secondary | ICD-10-CM | POA: Insufficient documentation

## 2022-11-01 LAB — CBC WITH DIFFERENTIAL/PLATELET
Abs Immature Granulocytes: 0.02 10*3/uL (ref 0.00–0.07)
Basophils Absolute: 0.1 10*3/uL (ref 0.0–0.1)
Basophils Relative: 1 %
Eosinophils Absolute: 0.1 10*3/uL (ref 0.0–0.5)
Eosinophils Relative: 1 %
HCT: 38.4 % (ref 36.0–46.0)
Hemoglobin: 12.5 g/dL (ref 12.0–15.0)
Immature Granulocytes: 0 %
Lymphocytes Relative: 42 %
Lymphs Abs: 3.4 10*3/uL (ref 0.7–4.0)
MCH: 27.9 pg (ref 26.0–34.0)
MCHC: 32.6 g/dL (ref 30.0–36.0)
MCV: 85.7 fL (ref 80.0–100.0)
Monocytes Absolute: 0.4 10*3/uL (ref 0.1–1.0)
Monocytes Relative: 5 %
Neutro Abs: 4.2 10*3/uL (ref 1.7–7.7)
Neutrophils Relative %: 51 %
Platelets: 300 10*3/uL (ref 150–400)
RBC: 4.48 MIL/uL (ref 3.87–5.11)
RDW: 12.9 % (ref 11.5–15.5)
WBC: 8.2 10*3/uL (ref 4.0–10.5)
nRBC: 0 % (ref 0.0–0.2)

## 2022-11-01 LAB — COMPREHENSIVE METABOLIC PANEL
ALT: 20 U/L (ref 0–44)
AST: 22 U/L (ref 15–41)
Albumin: 4.1 g/dL (ref 3.5–5.0)
Alkaline Phosphatase: 63 U/L (ref 38–126)
Anion gap: 9 (ref 5–15)
BUN: 11 mg/dL (ref 6–20)
CO2: 24 mmol/L (ref 22–32)
Calcium: 8.8 mg/dL — ABNORMAL LOW (ref 8.9–10.3)
Chloride: 103 mmol/L (ref 98–111)
Creatinine, Ser: 0.74 mg/dL (ref 0.44–1.00)
GFR, Estimated: >60 mL/min (ref >60)
Glucose, Bld: 106 mg/dL — ABNORMAL HIGH (ref 70–99)
Potassium: 3.6 mmol/L (ref 3.5–5.1)
Sodium: 136 mmol/L (ref 135–145)
Total Bilirubin: 0.5 mg/dL (ref 0.3–1.2)
Total Protein: 7.5 g/dL (ref 6.5–8.1)

## 2022-11-01 LAB — URINALYSIS, ROUTINE W REFLEX MICROSCOPIC
Bilirubin Urine: NEGATIVE
Glucose, UA: NEGATIVE mg/dL
Hgb urine dipstick: NEGATIVE
Ketones, ur: NEGATIVE mg/dL
Nitrite: NEGATIVE
Protein, ur: NEGATIVE mg/dL
Specific Gravity, Urine: <=1.005 (ref 1.005–1.030)
pH: 5.5 (ref 5.0–8.0)

## 2022-11-01 LAB — RAPID URINE DRUG SCREEN, HOSP PERFORMED
Amphetamines: NOT DETECTED
Barbiturates: NOT DETECTED
Benzodiazepines: NOT DETECTED
Cocaine: NOT DETECTED
Opiates: NOT DETECTED
Tetrahydrocannabinol: POSITIVE — AB

## 2022-11-01 LAB — CBG MONITORING, ED: Glucose-Capillary: 115 mg/dL — ABNORMAL HIGH (ref 70–99)

## 2022-11-01 LAB — URINALYSIS, MICROSCOPIC (REFLEX): RBC / HPF: NONE SEEN RBC/hpf (ref 0–5)

## 2022-11-01 LAB — HCG, QUANTITATIVE, PREGNANCY: hCG, Beta Chain, Quant, S: <1 m[IU]/mL (ref <5)

## 2022-11-01 MED ORDER — DIPHENHYDRAMINE HCL 50 MG/ML IJ SOLN
12.5000 mg | Freq: Once | INTRAMUSCULAR | Status: AC
  Administered 2022-11-01: 12.5 mg via INTRAVENOUS
  Filled 2022-11-01: qty 1

## 2022-11-01 MED ORDER — SODIUM CHLORIDE 0.9 % IV BOLUS
1000.0000 mL | Freq: Once | INTRAVENOUS | Status: AC
  Administered 2022-11-01: 1000 mL via INTRAVENOUS

## 2022-11-01 MED ORDER — ACETAMINOPHEN 500 MG PO TABS
1000.0000 mg | ORAL_TABLET | Freq: Once | ORAL | Status: AC
  Administered 2022-11-01: 1000 mg via ORAL
  Filled 2022-11-01: qty 2

## 2022-11-01 MED ORDER — PROCHLORPERAZINE EDISYLATE 10 MG/2ML IJ SOLN
10.0000 mg | Freq: Once | INTRAMUSCULAR | Status: AC
  Administered 2022-11-01: 10 mg via INTRAVENOUS
  Filled 2022-11-01: qty 2

## 2022-11-01 NOTE — Progress Notes (Signed)
  Patient Name: raevynn, muldowney DOB: 08/06/1980 MRN: [id number] Transferring facility: [facility] Requesting provider: Jearld Fenton, MD Reason for transfer: TIA workup vs hypertensive emergency 42 yo AAF without medical history, presents to ER with word finding, HA, confusion x 1 day. now back to baseline. BP only 162/101. no prior hx of HTN. teleneuro wanted TIA workup. CT head negateive Going to: [facility] Admission Status: observation Bed Type: med/tele To Do: TIA workup   [facility] will assume care on arrival to accepting facility. Until arrival, medical decision making responsibilities remain with the EDP.  However, [facility] available 24/7 for questions and assistance.   Nursing staff please page [facility] Admits and Consults [phone]) as soon as the patient arrives to the hospital.  Carollee Herter, DO [facility]

## 2022-11-01 NOTE — ED Triage Notes (Addendum)
Pt reports she woke up with a headache today unrelieved with 2 Excedrin, associated with blurred vision in both eyes. She reports the right side of her head feels "warm and tight." She reports feeling very "slow' and "out of whack." Denies hx of headaches.

## 2022-11-01 NOTE — ED Notes (Signed)
Tele-Neuro @ bedside

## 2022-11-01 NOTE — ED Provider Notes (Signed)
[facility] Provider Note   CSN: [id number] Arrival date & time: 11/01/22  1819     History  Chief Complaint  Patient presents with   Headache    Marcia Soto is a 42 y.o. female with no significant PMH who presents with headache, stroke-like symptoms.   Pt reports she woke up with a R-sided headache today unrelieved with 2 Excedrin, associated with blurred vision in both eyes.  That started approximately 9 AM.  She states that at approximately 11 AM she began to feel very "out of it," and foggy.  She had slurred speech and difficulty finding words.  She called one of her best friends who came immediately to the house and noted that she did not feel as if she knew where she was and was responding very slowly. Her headache was rated 8/10, doesn't have a history of headaches, never had this type of headache before. Her symptoms lasted until 5 pm when she felt like she returned to normal, but the headache has continued. Denies any N/T, asymmetric weakness, facial droop. No h/o stroke/MI. Was at work when her symtpoms started, no one at work had headache.     Headache      Home Medications Prior to Admission medications   Medication Sig Start Date End Date Taking? Authorizing Provider  albuterol (VENTOLIN HFA) 108 (90 Base) MCG/ACT inhaler Inhale 2 puffs into the lungs every 4 (four) hours as needed for wheezing or shortness of breath. 07/24/22   Piontek, Denny Peon, MD  etonogestrel (NEXPLANON) 68 MG IMPL implant 1 each by Subdermal route once.    [provider]  promethazine-dextromethorphan (PROMETHAZINE-DM) 6.25-15 MG/5ML syrup Take 5 mLs by mouth 4 (four) times daily as needed for cough. 11/20/20   Ivette Loyal, NP      Allergies    Other    Review of Systems   Review of Systems  Neurological:  Positive for headaches.   Review of systems Negative for f/c.  A 10 point review of systems was performed and is negative unless  otherwise reported in HPI.  Physical Exam Updated Vital Signs BP (!) 162/101 (BP Location: Left Arm)   Pulse 89   Temp 98.2 F (36.8 C)   Resp 18   Ht 5\' 4"  (1.626 m)   Wt 77.1 kg   SpO2 100%   BMI 29.18 kg/m  Physical Exam General: Normal appearing female, lying in bed.  HEENT: PERRLA, EOMI, no nystagmus, Sclera anicteric, MMM, trachea midline. Tongue protrudes midline. Cardiology: RRR, no murmurs/rubs/gallops. BL radial and DP pulses equal bilaterally.  Resp: Normal respiratory rate and effort. CTAB, no wheezes, rhonchi, crackles.  Abd: Soft, non-tender, non-distended. No rebound tenderness or guarding.  GU: Deferred. MSK: No peripheral edema or signs of trauma. Extremities without deformity or TTP. No cyanosis or clubbing. Skin: warm, dry.  Neuro: A&Ox4, CNs II-XII grossly intact. 5/5 strength all extremities. Sensation grossly intact.  Psych: Normal mood and affect.   1a  Level of consciousness: 0=alert; keenly responsive  1b. LOC questions:  0=Performs both tasks correctly  1c. LOC commands: 0=Performs both tasks correctly  2.  Best Gaze: 0=normal  3.  Visual: 0=No visual loss  4. Facial Palsy: 0=Normal symmetric movement  5a.  Motor left arm: 0=No drift, limb holds 90 (or 45) degrees for full 10 seconds  5b.  Motor right arm: 0=No drift, limb holds 90 (or 45) degrees for full 10 seconds  6a. motor left leg:  0=No drift, limb holds 90 (or 45) degrees for full 10 seconds  6b  Motor right leg:  0=No drift, limb holds 90 (or 45) degrees for full 10 seconds  7. Limb Ataxia: 0=Absent  8.  Sensory: 0=Normal; no sensory loss  9. Best Language:  0=No aphasia, normal  10. Dysarthria: 0=Normal  11. Extinction and Inattention: 0=No abnormality   Total:   0        ED Results / Procedures / Treatments   Labs (all labs ordered are listed, but only abnormal results are displayed) Labs Reviewed  COMPREHENSIVE METABOLIC PANEL - Abnormal; Notable for the following components:       Result Value   Glucose, Bld 106 (*)    Calcium 8.8 (*)    All other components within normal limits  CBG MONITORING, ED - Abnormal; Notable for the following components:   Glucose-Capillary 115 (*)    All other components within normal limits  CBC WITH DIFFERENTIAL/PLATELET  HCG, QUANTITATIVE, PREGNANCY    EKG EKG Interpretation  Date/Time:  Wednesday Nov 01 2022 18:35:28 EDT Ventricular Rate:  92 PR Interval:  154 QRS Duration: 86 QT Interval:  350 QTC Calculation: 433 R Axis:   67 Text Interpretation: Sinus rhythm Confirmed by Vivi Barrack [phone]) on 11/01/2022 7:28:26 PM  Radiology CT Head Wo Contrast  Result Date: 11/01/2022 CLINICAL DATA:  Headache, increasing frequency or severity EXAM: CT HEAD WITHOUT CONTRAST TECHNIQUE: Contiguous axial images were obtained from the base of the skull through the vertex without intravenous contrast. RADIATION DOSE REDUCTION: This exam was performed according to the departmental dose-optimization program which includes automated exposure control, adjustment of the mA and/or kV according to patient size and/or use of iterative reconstruction technique. COMPARISON:  04/11/2020 FINDINGS: Brain: No acute infarct or hemorrhage. Lateral ventricles and midline structures are unremarkable. No acute extra-axial fluid collections. No mass effect. Vascular: No hyperdense vessel or unexpected calcification. Skull: Normal. Negative for fracture or focal lesion. Sinuses/Orbits: No acute finding. Other: None. IMPRESSION: 1. No acute intracranial process. Electronically Signed   By: Sharlet Salina M.D.   On: 11/01/2022 18:57    Procedures Procedures    Medications Ordered in ED Medications  prochlorperazine (COMPAZINE) injection 10 mg (10 mg Intravenous Given 11/01/22 1902)  diphenhydrAMINE (BENADRYL) injection 12.5 mg (12.5 mg Intravenous Given 11/01/22 1901)  sodium chloride 0.9 % bolus 1,000 mL (1,000 mLs Intravenous New Bag/Given 11/01/22 1857)   acetaminophen (TYLENOL) tablet 1,000 mg (1,000 mg Oral Given 11/01/22 1901)    ED Course/ Medical Decision Making/ A&P                          Medical Decision Making Amount and/or Complexity of Data Reviewed Labs: ordered. Decision-making details documented in ED Course. Radiology: ordered. Decision-making details documented in ED Course.  Risk OTC drugs. Prescription drug management. Decision regarding hospitalization.    This patient presents to the ED for concern of headache, stroke-like symptoms; this involves an extensive number of treatment options, and is a complaint that carries with it a high risk of complications and morbidity.    MDM:    Ddx of acute altered mental status or encephalopathy considered but not limited to: -Intracranial abnormalities such as ICH, hydrocephalus - pt with neuro sxs associated with headache, will eval for ICH w/ CTH.  -TIA - patient w/ reported word-finding difficulty and fogginess w/ visual symptoms lasted 5 hours, resolved on their own. C/f TIA. -Hypertensive encephalopathy -  pt with BP >160 mmHg, potentially w/ untreated HTN, no daily meds, unsure what her BP was during her symptoms earlier -Consider complicated migraine given R-sided headache and associated symptoms, pt has no h/o migraines -Unlikely CO poisoning, no one at work had her headache, heaters not on -Ocular exam unremarkable, no AACG signs -Infection such as UTI, PNA - patient has no f/c, CP, SOB, cough, urinary symptoms -Toxic ingestion -patient denies any alcohol or drug use, denies any new medication changes. She reports that she does not take any daily medications at all.  -Electrolyte abnormalities or hyper/hypoglycemia -arrhythmia or vagal episode possible, however patient reports that her symptoms lasted a full 5 hours and were not associated with any type of chest pain shortness of breath.   Glucose: 106 mg/dL AC: no BP: 829/562  Will give headache cocktail and  consult w/ neurology   Clinical Course as of 11/01/22 2202  Wed Nov 01, 2022  1911 WBC: 8.2 [HN]  1911 Hemoglobin: 12.5 [HN]  1911 CT Head Wo Contrast 1. No acute intracranial process. [HN]  1957 HCG, Beta Chain, Quant, S: <1 [HN]  1957 Comprehensive metabolic panel(!) unremarkable [HN]  1957 Glucose-Capillary(!): 115 [HN]  2140 D/w neurologist who agrees that symptoms earlier concerning for TIA vs hypertensive emergency. Recommending aspirin, plavix, and admission. Consulted to hospitalist. [HN]    Clinical Course User Index [HN] Loetta Rough, MD    Labs: I Ordered, and personally interpreted labs.  The pertinent results include:  those listed above  Imaging Studies ordered: I ordered imaging studies including CTH I independently visualized and interpreted imaging. I agree with the radiologist interpretation  Additional history obtained from friend at bedside, chart review.   Cardiac Monitoring: The patient was maintained on a cardiac monitor.  I personally viewed and interpreted the cardiac monitored which showed an underlying rhythm of: NSR  Reevaluation: After the interventions noted above, I reevaluated the patient and found that they have :improved  Social Determinants of Health: Patient lives independently. Lives alone.   Disposition:  Admit to medicine w/ neuro following  Co morbidities that complicate the patient evaluation History reviewed. No pertinent past medical history.   Medicines Meds ordered this encounter  Medications   prochlorperazine (COMPAZINE) injection 10 mg   diphenhydrAMINE (BENADRYL) injection 12.5 mg   sodium chloride 0.9 % bolus 1,000 mL   acetaminophen (TYLENOL) tablet 1,000 mg    I have reviewed the patients home medicines and have made adjustments as needed  Problem List / ED Course: Problem List Items Addressed This Visit   None Visit Diagnoses     Acute nonintractable headache, unspecified headache type    -  Primary    Relevant Medications   acetaminophen (TYLENOL) tablet 1,000 mg (Completed)   Transient alteration of awareness                       This note was created using dictation software, which may contain spelling or grammatical errors.    Loetta Rough, MD 11/01/22 2207

## 2022-11-01 NOTE — Consult Note (Signed)
[facility] TeleNeurology Consult Services  Stat Consult  Patient Name:   Marcia Soto, Marcia Soto Date of Birth:   1981-01-03 Identification Number:   MRN - [id number] Date of Service:   11/01/2022 20:46:23  Diagnosis:       I67.4 - Hypertensive encephalopathy       I63.30 - Cerebrovascular accident (CVA) due to thrombosis of cerebral artery (HCCC)  Impression Pt presents after an episode of confusion, word finding difficulty, blurry vision, dizziness and headache that is now resolving. She is outside the time window for thrombolytics. Differential includes hypertensive encephalopathy vs stroke vs TIA. Would recommend MRI brain and full stroke work-up to evaluate.   Recommendations: Our recommendations are outlined below.  Diagnostic Studies : MRI head without contrast Please order MRA head without contrast Please order MRA neck with contrast Please order check Echo  Laboratory Studies : Lipid panel Please order Hemoglobin A1c Please order TSH Please order  Antithrombotic Medication : Initiate dual antiplatelet therapy with Aspirin 81 mg daily and Clopidogrel 75 mg daily Please order Permissive hypertension, Antihypertensives with prn for first 24-48 hrs post stroke onset. If BP greater than 220/120 give Labetalol IV or Vasotec IV Please order Statins for LDL goal less than 70 Please order  Nursing Recommendations : IV Fluids, avoid dextrose containing fluids, Maintain euglycemiaNeuro checks q4 hrs x 24 hrs and then per shiftHead of bed 30 degreesContinue with Telemetry  Consultations : Recommend Speech therapy if failed dysphagia screenPhysical therapy/Occupational therapy  DVT Prophylaxis : Choice of Primary Team  Disposition : Neurology will follow   ----------------------------------------------------------------------------------------------------    Metrics: [facility] Notification Time: 11/01/2022 20:42:16 Stamp Time: 11/01/2022  20:46:23 Callback Response Time: 11/01/2022 20:46:46  Primary Provider Notified of Diagnostic Impression and Management Plan on: 11/01/2022 21:44:28   CT HEAD: As Per Radiologist CT Head Showed No Acute Hemorrhage or Acute Core Infarct Reviewed   Imaging reviewed  Labs reviewed   ----------------------------------------------------------------------------------------------------  Chief Complaint: altered mental status  History of Present Illness: Patient is a 42 year old Female. Pt woke up this morning with a headache around 9am. It was getting worse throughout the day and she had to take excedrin twice and it didn't seem to help. Then in the afternoon she had blurry vision, dizziness, word finding difficulty and confusion started around 4pm. That lasted until she got to the hospital and was given a medication for HTN. She doesn't usually take anti-hypertensives, doesn't have any known PMH. Currently most of her symptoms have resolved, she does feel like her vision may be a bit hazy but she can see fine for now.  tylenol, benadryl, NS, compazine    Past Medical History: Other PMH:  none  Medications:  No Anticoagulant use  No Antiplatelet use Reviewed EMR for current medications  Allergies:  NKDA  Social History: Current Employee : occasional alcohol use Smoking: No Alcohol Use: Yes Drug Use: No  Family History:  There is no family history of premature cerebrovascular disease pertinent to this consultation  ROS : 14 Points Review of Systems was performed and was negative except mentioned in HPI.  Past Surgical History: There Is No Surgical History Contributory To Today's Visit    Examination: BP(162/101), Pulse(89), Blood Glucose(106) 1A: Level of Consciousness - Alert; keenly responsive + 0 1B: Ask Month and Age - Both Questions Right + 0 1C: Blink Eyes & Squeeze Hands - Performs Both Tasks + 0 2: Test Horizontal Extraocular Movements - Normal + 0 3:  Test Visual Fields - No  Visual Loss + 0 4: Test Facial Palsy (Use Grimace if Obtunded) - Normal symmetry + 0 5A: Test Left Arm Motor Drift - No Drift for 10 Seconds + 0 5B: Test Right Arm Motor Drift - No Drift for 10 Seconds + 0 6A: Test Left Leg Motor Drift - No Drift for 5 Seconds + 0 6B: Test Right Leg Motor Drift - No Drift for 5 Seconds + 0 7: Test Limb Ataxia (FNF/Heel-Shin) - No Ataxia + 0 8: Test Sensation - Normal; No sensory loss + 0 9: Test Language/Aphasia - Normal; No aphasia + 0 10: Test Dysarthria - Normal + 0 11: Test Extinction/Inattention - No abnormality + 0  NIHSS Score: 0  Spoke with : Dr    Patient / Family was informed the Neurology Consult would occur via TeleHealth consult by way of interactive audio and video telecommunications and consented to receiving care in this manner.  Patient is being evaluated for possible acute neurologic impairment and high probability of imminent or life - threatening deterioration.I spent total of 35 minutes providing care to this patient, including time for face to face visit via telemedicine, review of medical records, imaging studies and discussion of findings with providers, the patient and / or family.   Dr Shade Flood   [facility] For Inpatient follow-up with [facility] physician please call [facility] [phone]. This is not an outpatient service. Post hospital discharge, please contact hospital directly.  Please do not communicate with [facility] physicians via secure chat. If you have any questions, Please contact [facility]. Please call or reconsult our service if there are any clinical or diagnostic changes.

## 2022-11-01 NOTE — ED Notes (Signed)
Pt. Is speaking with teleneuro at this time via computer.  Pt. Said she woke around 9am with this headache and it progressively got worse.  Pt. Reports she she at the present time feels like her vision is hazy but the headache is gone.    Neurologist is having Pt. Answer questions and do things for stroke scale.

## 2022-11-01 NOTE — ED Notes (Signed)
Pt. Reports she has never had high blood pressure before.

## 2022-11-01 NOTE — ED Notes (Signed)
Pt. Feels that she is slower to respond and her words are hard to find when she speaks.

## 2022-11-02 ENCOUNTER — Observation Stay (HOSPITAL_BASED_OUTPATIENT_CLINIC_OR_DEPARTMENT_OTHER): Payer: Self-pay

## 2022-11-02 ENCOUNTER — Observation Stay (HOSPITAL_COMMUNITY): Payer: Self-pay

## 2022-11-02 DIAGNOSIS — G459 Transient cerebral ischemic attack, unspecified: Secondary | ICD-10-CM

## 2022-11-02 DIAGNOSIS — A599 Trichomoniasis, unspecified: Secondary | ICD-10-CM | POA: Insufficient documentation

## 2022-11-02 DIAGNOSIS — F1290 Cannabis use, unspecified, uncomplicated: Secondary | ICD-10-CM | POA: Insufficient documentation

## 2022-11-02 DIAGNOSIS — R55 Syncope and collapse: Secondary | ICD-10-CM

## 2022-11-02 DIAGNOSIS — E876 Hypokalemia: Secondary | ICD-10-CM | POA: Insufficient documentation

## 2022-11-02 LAB — BASIC METABOLIC PANEL
Anion gap: 7 (ref 5–15)
BUN: 9 mg/dL (ref 6–20)
CO2: 24 mmol/L (ref 22–32)
Calcium: 8.2 mg/dL — ABNORMAL LOW (ref 8.9–10.3)
Chloride: 104 mmol/L (ref 98–111)
Creatinine, Ser: 0.73 mg/dL (ref 0.44–1.00)
GFR, Estimated: >60 mL/min (ref >60)
Glucose, Bld: 112 mg/dL — ABNORMAL HIGH (ref 70–99)
Potassium: 3.4 mmol/L — ABNORMAL LOW (ref 3.5–5.1)
Sodium: 135 mmol/L (ref 135–145)

## 2022-11-02 LAB — CBC
HCT: 36.1 % (ref 36.0–46.0)
Hemoglobin: 11.8 g/dL — ABNORMAL LOW (ref 12.0–15.0)
MCH: 27.8 pg (ref 26.0–34.0)
MCHC: 32.7 g/dL (ref 30.0–36.0)
MCV: 85.1 fL (ref 80.0–100.0)
Platelets: 273 10*3/uL (ref 150–400)
RBC: 4.24 MIL/uL (ref 3.87–5.11)
RDW: 12.9 % (ref 11.5–15.5)
WBC: 6.7 10*3/uL (ref 4.0–10.5)
nRBC: 0 % (ref 0.0–0.2)

## 2022-11-02 LAB — LIPID PANEL
Cholesterol: 158 mg/dL (ref 0–200)
HDL: 41 mg/dL (ref >40)
LDL Cholesterol: 104 mg/dL — ABNORMAL HIGH (ref 0–99)
Total CHOL/HDL Ratio: 3.9 RATIO
Triglycerides: 65 mg/dL (ref <150)
VLDL: 13 mg/dL (ref 0–40)

## 2022-11-02 LAB — ECHOCARDIOGRAM COMPLETE BUBBLE STUDY
Area-P 1/2: 5.54 cm2
P 1/2 time: 579 msec
S' Lateral: 2.2 cm

## 2022-11-02 LAB — TSH: TSH: 2.888 u[IU]/mL (ref 0.350–4.500)

## 2022-11-02 LAB — HIV ANTIBODY (ROUTINE TESTING W REFLEX): HIV Screen 4th Generation wRfx: NONREACTIVE

## 2022-11-02 MED ORDER — METRONIDAZOLE 500 MG PO TABS: 500.0000 mg | ORAL_TABLET | Freq: Two times a day (BID) | ORAL | 0 refills | Status: AC

## 2022-11-02 MED ORDER — POTASSIUM CHLORIDE CRYS ER 20 MEQ PO TBCR
40.0000 meq | EXTENDED_RELEASE_TABLET | Freq: Once | ORAL | Status: AC
  Administered 2022-11-02: 40 meq via ORAL
  Filled 2022-11-02: qty 2

## 2022-11-02 MED ORDER — ACETAMINOPHEN 325 MG PO TABS: 650.0000 mg | ORAL_TABLET | Freq: Four times a day (QID) | ORAL | Status: DC | PRN

## 2022-11-02 MED ORDER — ASPIRIN 81 MG PO TBEC: 81.0000 mg | DELAYED_RELEASE_TABLET | Freq: Every day | ORAL | 12 refills | Status: AC

## 2022-11-02 MED ORDER — MELATONIN 5 MG PO TABS: 5.0000 mg | ORAL_TABLET | Freq: Every evening | ORAL | Status: DC | PRN

## 2022-11-02 MED ORDER — CLOPIDOGREL BISULFATE 75 MG PO TABS
75.0000 mg | ORAL_TABLET | Freq: Every day | ORAL | Status: DC
  Administered 2022-11-02: 75 mg via ORAL
  Filled 2022-11-02: qty 1

## 2022-11-02 MED ORDER — ATORVASTATIN CALCIUM 80 MG PO TABS
80.0000 mg | ORAL_TABLET | Freq: Every day | ORAL | Status: DC
  Administered 2022-11-02: 80 mg via ORAL
  Filled 2022-11-02: qty 1

## 2022-11-02 MED ORDER — PROCHLORPERAZINE EDISYLATE 10 MG/2ML IJ SOLN: 5.0000 mg | Freq: Four times a day (QID) | INTRAMUSCULAR | Status: DC | PRN

## 2022-11-02 MED ORDER — METRONIDAZOLE 500 MG PO TABS
500.0000 mg | ORAL_TABLET | Freq: Two times a day (BID) | ORAL | Status: DC
  Administered 2022-11-02: 500 mg via ORAL
  Filled 2022-11-02 (×2): qty 1

## 2022-11-02 MED ORDER — ENOXAPARIN SODIUM 40 MG/0.4ML IJ SOSY
40.0000 mg | PREFILLED_SYRINGE | INTRAMUSCULAR | Status: DC
  Administered 2022-11-02: 40 mg via SUBCUTANEOUS
  Filled 2022-11-02: qty 0.4

## 2022-11-02 MED ORDER — ATORVASTATIN CALCIUM 20 MG PO TABS: 20.0000 mg | ORAL_TABLET | Freq: Every day | ORAL | 3 refills | Status: AC

## 2022-11-02 MED ORDER — SODIUM CHLORIDE 0.9 % IV SOLN: INTRAVENOUS | Status: DC

## 2022-11-02 MED ORDER — ASPIRIN 81 MG PO TBEC
81.0000 mg | DELAYED_RELEASE_TABLET | Freq: Every day | ORAL | Status: DC
  Administered 2022-11-02: 81 mg via ORAL
  Filled 2022-11-02: qty 1

## 2022-11-02 MED ORDER — POLYETHYLENE GLYCOL 3350 17 G PO PACK: 17.0000 g | PACK | Freq: Every day | ORAL | Status: DC | PRN

## 2022-11-02 NOTE — Care Management (Signed)
  Transition of Care [facility]) Screening Note   Patient Details  Name: Marcia Soto Date of Birth: [birth year]   Transition of Care [facility]) CM/SW Contact:    Lockie Pares, RN Phone Number: 11/02/2022, 9:54 AM  Patient uninsured,  Spoke with patient via phone. PCP appointment set up and on AVS. No further DC needs Identified.  Transition of Care Department [facility]) has reviewed patient and no TOC needs have been identified at this time. We will continue to monitor patient advancement through interdisciplinary progression rounds. If new patient transition needs arise, please place a TOC consult.

## 2022-11-02 NOTE — Progress Notes (Signed)
  Echocardiogram 2D Echocardiogram has been performed.  Marcia Soto 11/02/2022, 9:09 AM

## 2022-11-02 NOTE — H&P (Signed)
History and Physical  Marcia Soto UXL:[id number] DOB: 06-Jul-1980 DOA: 11/01/2022  Referring physician: Accepted by Dr. Imogene Burn, [facility], Hospitalist service.  PCP: Patient, No Pcp Per  Outpatient Specialists: None Patient coming from: Home through [facility] ED  Chief Complaint: Difficulty with word finding.   HPI: Marcia Soto is a 42 y.o. female with no significant past medical history who initially presented to [facility] ED with complaints of headache unrelieved with 2 home Excedrin's, associated with bilateral blurred vision.  Onset of symptoms around 9 AM.  Symptoms worsened around 11 AM with slurred speech and difficulty finding words.  She called one of her friends who came to her house and found her confused and slow to respond.  Her slurred speech resolved around 5PM.  However her headache persisted.  She presented to the ED for further evaluation.  In the ED, due to concern for a TIA, she was seen by Telene[facility].  Teleneuro recommended admission for TIA workup.  Admitted by [facility], hospitalist service, to [facility] telemetry medical unit as observation status.  Accepted by Dr. Imogene Burn.  ED Course: Temperature 98.2.  BP 147/92, pulse 80, respiration rate, O2 saturation 100% on room air.  Lab studies remarkable for UDS positive for THC CBC essentially unremarkable.  Serum glucose 160 calcium 8.8, albumin 4.1. UA + Trichomonas.  Review of Systems: Review of systems as noted in the HPI. All other systems reviewed and are negative.   History reviewed. No pertinent past medical history. Past Surgical History:  Procedure Laterality Date   I & D EXTREMITY Bilateral 02/11/2018   Procedure: IRRIGATION AND DEBRIDEMENT BILATERAL /MIDDLE FINGERS;  Surgeon: Kathryne Hitch, MD;  Location: [facility] ORS;  Service: Orthopedics;  Laterality: Bilateral;    Social History:  reports that she has never smoked. She has never used smokeless tobacco. She reports current alcohol use. She reports that she does not  currently use drugs.   Allergies  Allergen Reactions   Other Anaphylaxis    Pickles     Family History  Family history unknown: Yes      Prior to Admission medications   Medication Sig Start Date End Date Taking? Authorizing Provider  albuterol (VENTOLIN HFA) 108 (90 Base) MCG/ACT inhaler Inhale 2 puffs into the lungs every 4 (four) hours as needed for wheezing or shortness of breath. 07/24/22   Piontek, Denny Peon, MD  etonogestrel (NEXPLANON) 68 MG IMPL implant 1 each by Subdermal route once.    [provider]  promethazine-dextromethorphan (PROMETHAZINE-DM) 6.25-15 MG/5ML syrup Take 5 mLs by mouth 4 (four) times daily as needed for cough. 11/20/20   Ivette Loyal, NP    Physical Exam: BP (!) 147/92 (BP Location: Right Arm)   Pulse 80   Temp 98.2 F (36.8 C) (Oral)   Resp 17   Ht 5\' 4"  (1.626 m)   Wt 77.1 kg   SpO2 100%   BMI 29.18 kg/m   General: 42 y.o. year-old female well developed well nourished in no acute distress.  Alert and oriented x3. Cardiovascular: Regular rate and rhythm with no rubs or gallops.  No thyromegaly or JVD noted.  No lower extremity edema. 2/4 pulses in all 4 extremities. Respiratory: Clear to auscultation with no wheezes or rales. Good inspiratory effort. Abdomen: Soft nontender nondistended with normal bowel sounds x4 quadrants. Muskuloskeletal: No cyanosis, clubbing or edema noted bilaterally Neuro: CN II-XII intact, strength, sensation, reflexes Skin: No ulcerative lesions noted or rashes Psychiatry: Judgement and insight appear normal. Mood is appropriate for  condition and setting          Labs on Admission:  Basic Metabolic Panel: Recent Labs  Lab 11/01/22 1857  NA 136  K 3.6  CL 103  CO2 24  GLUCOSE 106*  BUN 11  CREATININE 0.74  CALCIUM 8.8*   Liver Function Tests: Recent Labs  Lab 11/01/22 1857  AST 22  ALT 20  ALKPHOS 63  BILITOT 0.5  PROT 7.5  ALBUMIN 4.1   No results for input(s): "LIPASE", "AMYLASE" in  the last 168 hours. No results for input(s): "AMMONIA" in the last 168 hours. CBC: Recent Labs  Lab 11/01/22 1857  WBC 8.2  NEUTROABS 4.2  HGB 12.5  HCT 38.4  MCV 85.7  PLT 300   Cardiac Enzymes: No results for input(s): "CKTOTAL", "CKMB", "CKMBINDEX", "TROPONINI" in the last 168 hours.  BNP (last 3 results) No results for input(s): "BNP" in the last 8760 hours.  ProBNP (last 3 results) No results for input(s): "PROBNP" in the last 8760 hours.  CBG: Recent Labs  Lab 11/01/22 1937  GLUCAP 115*    Radiological Exams on Admission: CT Head Wo Contrast  Result Date: 11/01/2022 CLINICAL DATA:  Headache, increasing frequency or severity EXAM: CT HEAD WITHOUT CONTRAST TECHNIQUE: Contiguous axial images were obtained from the base of the skull through the vertex without intravenous contrast. RADIATION DOSE REDUCTION: This exam was performed according to the departmental dose-optimization program which includes automated exposure control, adjustment of the mA and/or kV according to patient size and/or use of iterative reconstruction technique. COMPARISON:  04/11/2020 FINDINGS: Brain: No acute infarct or hemorrhage. Lateral ventricles and midline structures are unremarkable. No acute extra-axial fluid collections. No mass effect. Vascular: No hyperdense vessel or unexpected calcification. Skull: Normal. Negative for fracture or focal lesion. Sinuses/Orbits: No acute finding. Other: None. IMPRESSION: 1. No acute intracranial process. Electronically Signed   By: Sharlet Salina M.D.   On: 11/01/2022 18:57    EKG: I independently viewed the EKG done and my findings are as followed: Sinus rhythm rate of 92.  Nonspecific ST-T changes.  QTc 433.  Assessment/Plan Present on Admission:  TIA (transient ischemic attack)  Principal Problem:   TIA (transient ischemic attack)  TIA Admitted for TIA workup Obtain MRI brain MRA head without contrast, B/L carotid doppler US 2D echo Fasting lipid  panel, hemoglobin A1c, TSH Start dual antiplatelets aspirin 81 mg daily, clopidogrel 75 mg daily, Lipitor 80 mg daily Neuro checks q4H Monitor on telemetry Seen by Tele neurology  Trichomoniasis UTI Po Flagyl 500 mg BID x 7 days Advised to also have her partner treated for the same.  The patient was receptive.  THC use TOC consulted to assist with resources for [facility] cessation  Overweight BMI 29 Recommend weight loss outpatient with regular physical activity and healthy diet.    DVT prophylaxis: Subcu Lovenox daily.   Code Status: Full code  Family Communication: None at bedside.  Disposition Plan: Admitted to telemetry medical unit  Consults called: Neurology  Admission status: Observation status.   Status is: Observation    Darlin Drop MD [facility] Pager [phone]  If 7PM-7AM, please contact night-coverage [email or web address] Password [facility]  11/02/2022, 12:25 AM

## 2022-11-02 NOTE — Progress Notes (Signed)
Pt arrived via stretch. Pt is A&OX4. Speech is clear and breathing equal and  unlabored. Neurologically intact. MAEW. Denied pain. VSS; placed on telemetry. POC reviewed with pt.

## 2022-11-02 NOTE — Plan of Care (Signed)
  Problem: Education: Goal: Knowledge of General Education information will improve Description: Including pain rating scale, medication(s)/side effects and non-pharmacologic comfort measures Outcome: Adequate for Discharge   Problem: Health Behavior/Discharge Planning: Goal: Ability to manage health-related needs will improve Outcome: Adequate for Discharge   Problem: Clinical Measurements: Goal: Ability to maintain clinical measurements within normal limits will improve Outcome: Adequate for Discharge Goal: Will remain free from infection Outcome: Adequate for Discharge Goal: Diagnostic test results will improve Outcome: Adequate for Discharge Goal: Respiratory complications will improve Outcome: Adequate for Discharge Goal: Cardiovascular complication will be avoided Outcome: Adequate for Discharge   Problem: Activity: Goal: Risk for activity intolerance will decrease Outcome: Adequate for Discharge   Problem: Nutrition: Goal: Adequate nutrition will be maintained Outcome: Adequate for Discharge   Problem: Coping: Goal: Level of anxiety will decrease Outcome: Adequate for Discharge   Problem: Elimination: Goal: Will not experience complications related to bowel motility Outcome: Adequate for Discharge Goal: Will not experience complications related to urinary retention Outcome: Adequate for Discharge   Problem: Pain Managment: Goal: General experience of comfort will improve Outcome: Adequate for Discharge   Problem: Safety: Goal: Ability to remain free from injury will improve Outcome: Adequate for Discharge   Problem: Skin Integrity: Goal: Risk for impaired skin integrity will decrease Outcome: Adequate for Discharge   Problem: Education: Goal: Knowledge of disease or condition will improve Outcome: Adequate for Discharge Goal: Knowledge of secondary prevention will improve (MUST DOCUMENT ALL) Outcome: Adequate for Discharge

## 2022-11-02 NOTE — Progress Notes (Signed)
VASCULAR LAB    Carotid duplex has been performed.  See CV proc for preliminary results.   Evo Aderman, RVT 11/02/2022, 9:34 AM

## 2022-11-02 NOTE — Consult Note (Addendum)
Neurology Consultation  Reason for Consult: Bilateral blurred vision, slurred speech, word-finding difficulties Referring Physician: Dr. Alanda Slim  CC: Headache, slurred speech, word finding difficulties  History is obtained from: Chart review, patient   HPI: Marcia Soto is a 42 y.o. female with no known medical history who presented to an outside ED for evaluation of an episode of word-finding difficulty, blurred vision, dizziness, and headache.  Patient states that she woke up yesterday morning with a headache and proceeded to get ready for work.  She took 2 Excedrin without headache improvement.  On the way to work, patient noticed that her vision was hazy bilaterally and while at work at approximately 11:00, she noticed that she had speech deficits where she could not hold a conversation and her whole body began to feel hot.  Patient reports right-sided throbbing pressure headache with a heat sensation as well.  Patient states that she does sometimes gets headaches but denies a history of migraine headaches.  She does endorse that this particular headache was much worse than usual.  Coworkers noticed that she was slower to respond than usual and patient proceeded to [facility] for further evaluation.  Patient was evaluated by neurology telemetry specialist who recommended admission for stroke/TIA workup versus hypertensive encephalopathy.  Patient was treated with a headache cocktail with resolution of patient's symptoms and headache and around 7 PM yesterday.  While in the ED, patient notes that her blood pressure was elevated with a systolic of 160 mmHg which she reports is much higher than her typical blood pressure. She denies any prior history of strokes or TIAs.  She denies history of migraine episodes or complicated migraine. ROS: A complete ROS was performed and is negative except as noted in the HPI.  History reviewed. No pertinent past medical history.  Family History  Family history unknown:  Yes   Social History:   reports that she has never smoked. She has never used smokeless tobacco. She reports current alcohol use. She reports that she does not currently use drugs.  Medications  Current Facility-Administered Medications:    0.9 %  sodium chloride infusion, , Intravenous, Continuous, Darlin Drop, DO, Last Rate: 50 mL/hr at 11/02/22 0242, New Bag at 11/02/22 0242   acetaminophen (TYLENOL) tablet 650 mg, 650 mg, Oral, Q6H PRN, Darlin Drop, DO   aspirin EC tablet 81 mg, 81 mg, Oral, Daily, Dow Adolph N, DO, 81 mg at 11/02/22 0234   atorvastatin (LIPITOR) tablet 80 mg, 80 mg, Oral, Daily, Dow Adolph N, DO, 80 mg at 11/02/22 0234   clopidogrel (PLAVIX) tablet 75 mg, 75 mg, Oral, Daily, Hall, Carole N, DO, 75 mg at 11/02/22 0233   enoxaparin (LOVENOX) injection 40 mg, 40 mg, Subcutaneous, Q24H, Hall, Carole N, DO   melatonin tablet 5 mg, 5 mg, Oral, QHS PRN, Hall, Carole N, DO   metroNIDAZOLE (FLAGYL) tablet 500 mg, 500 mg, Oral, BID, Hall, Carole N, DO, 500 mg at 11/02/22 0436   polyethylene glycol (MIRALAX / GLYCOLAX) packet 17 g, 17 g, Oral, Daily PRN, Hall, Carole N, DO   potassium chloride SA (KLOR-CON M) CR tablet 40 mEq, 40 mEq, Oral, Once, Gonfa, Taye T, MD   prochlorperazine (COMPAZINE) injection 5 mg, 5 mg, Intravenous, Q6H PRN, Darlin Drop, DO  Exam: Current vital signs: BP (!) 157/100 (BP Location: Left Arm)   Pulse 78   Temp 98.3 F (36.8 C) (Oral)   Resp 18   Ht 5\' 4"  (1.626 m)   Wt  77.1 kg   SpO2 98%   BMI 29.18 kg/m  Vital signs in last 24 hours: Temp:  [98.2 F (36.8 C)-98.3 F (36.8 C)] 98.3 F (36.8 C) (05/23 0356) Pulse Rate:  [78-89] 78 (05/23 0356) Resp:  [17-18] 18 (05/23 0356) BP: (124-163)/(85-101) 157/100 (05/23 0356) SpO2:  [98 %-100 %] 98 % (05/23 0356) FiO2 (%):  [21 %] 21 % (05/23 0025) Weight:  [77.1 kg] 77.1 kg (05/22 1831)  GENERAL: Pleasant African-American female awake, alert, in no acute distress. Psych: Affect  appropriate for situation, patient is calm and cooperative with examination Head: Normocephalic and atraumatic, without obvious abnormality EENT: Normal conjunctivae, dry mucous membranes, no OP obstruction LUNGS: Normal respiratory effort. Non-labored breathing on room air CV: Regular rate and rhythm on telemetry ABDOMEN: Soft, non-tender, non-distended Extremities: warm, well perfused, without obvious deformity  NEURO:  Mental Status: Awake, alert, and oriented to person, place, time, and situation. She is able to provide a clear and coherent history of present illness. Speech/Language: speech is intact without dysarthria Naming, repetition, fluency, and comprehension intact without aphasia  No neglect is noted Cranial Nerves:  II: PERRL. visual fields full.  III, IV, VI: EOMI without nystagmus, diplopia, ptosis. V: Sensation is intact to light touch and symmetrical to face.  VII: Face is symmetric resting and smiling.  VIII: Hearing intact to voice IX, X: Palate elevation is symmetric. Phonation normal.  XI: Normal sternocleidomastoid and trapezius muscle strength XII: Tongue protrudes midline without fasciculations.   Motor: 5/5 strength is all muscle groups unilateral weakness or vertical drift throughout. Tone is normal. Bulk is normal.  Sensation: Intact to light touch bilaterally in all four extremities.  Coordination: FTN intact bilaterally. HKS intact bilaterally. No pronator drift.  Gait: Steady without the use of assistive device  Labs I have reviewed labs in epic and the results pertinent to this consultation are: CBC    Component Value Date/Time   WBC 6.7 11/02/2022 0456   RBC 4.24 11/02/2022 0456   HGB 11.8 (L) 11/02/2022 0456   HCT 36.1 11/02/2022 0456   PLT 273 11/02/2022 0456   MCV 85.1 11/02/2022 0456   MCH 27.8 11/02/2022 0456   MCHC 32.7 11/02/2022 0456   RDW 12.9 11/02/2022 0456   LYMPHSABS 3.4 11/01/2022 1857   MONOABS 0.4 11/01/2022 1857   EOSABS  0.1 11/01/2022 1857   BASOSABS 0.1 11/01/2022 1857   CMP     Component Value Date/Time   NA 135 11/02/2022 0456   K 3.4 (L) 11/02/2022 0456   CL 104 11/02/2022 0456   CO2 24 11/02/2022 0456   GLUCOSE 112 (H) 11/02/2022 0456   BUN 9 11/02/2022 0456   CREATININE 0.73 11/02/2022 0456   CALCIUM 8.2 (L) 11/02/2022 0456   PROT 7.5 11/01/2022 1857   ALBUMIN 4.1 11/01/2022 1857   AST 22 11/01/2022 1857   ALT 20 11/01/2022 1857   ALKPHOS 63 11/01/2022 1857   BILITOT 0.5 11/01/2022 1857   GFRNONAA >60 11/02/2022 0456   GFRAA >60 08/14/2018 0848   Lipid Panel     Component Value Date/Time   CHOL 158 11/02/2022 0456   TRIG 65 11/02/2022 0456   HDL 41 11/02/2022 0456   CHOLHDL 3.9 11/02/2022 0456   VLDL 13 11/02/2022 0456   LDLCALC 104 (H) 11/02/2022 0456   Imaging I have reviewed the images obtained:  MRI examination of the brain is unremarkable, normal MRI and MRA of the brain   Carotid doppler imaging bilateral carotid vessels were  near normal with only minimal thickening or plaque.  Bilateral vertebral arteries demonstrate antegrade flow.  Normal flow hemodynamics were seen in bilateral subclavian arteries.  CT head with no acute intracranial process  Echocardiogram LVEF 65 to 70%  Assessment: 42 year old female with no known medical history presented to outside ED 11/01/2022 for evaluation of headache with associated hazy vision, word finding difficulty, slowed responses, and slurred speech.  Patient's symptoms resolved following headache cocktail.  Stroke workup complete with results unremarkable for stroke etiology.  Patient's presentation is felt to be most consistent consistent with complex migraine headache at this time.  Impression: Complex migraine headache versus hypertensive crisis.  Doubt TIA or stroke due to absence of clear focal symptoms or deficits Recommendations: - Statin for LDL goal of <70 - Aspirin monotherapy for stroke prophylaxis - Educate on low fat  diet - Follow up with outpatient neurology in 4 weeks - No further stroke work up indicated at this time, stroke team will sign off. Please reach out to provider on call for further questions or concerns.   Lanae Boast, AGAC-NP [facility] Pager: [phone]  STROKE MD NOTE :  I have personally obtained history,examined this patient, reviewed notes, independently viewed imaging studies, participated in medical decision making and plan of care.ROS completed by me personally and pertinent positives fully documented  I have made any additions or clarifications directly to the above note. Agree with note above.  Patient developed sudden onset of headache accompanied by some hazy residual vision and speech difficulties but denied associated focal neurological symptoms.  She did throw up once.  She denies prior history of migraines has had somewhat similar but milder headaches which have responded to over-the-counter analgesics.  MRI is negative for acute stroke.  Neurovascular imaging is also unremarkable.  Check echocardiogram results.  Recommend aspirin alone for stroke prevention and maintain aggressive risk factor modification.  Long discussion with patient and answered questions.  Greater than 50% time during this 60-minute consultation visit were spent in counseling and coordination of care about her possible atypical migraine episode and answered questions.  Delia Heady, MD Medical Director [facility] Pager: [phone] 11/02/2022 2:56 PM

## 2022-11-02 NOTE — Discharge Summary (Signed)
Physician Discharge Summary  Marcia Soto QMV:[id number] DOB: 29-Nov-1980 DOA: 11/01/2022  PCP: Patient, No Pcp Per  Admit date: 11/01/2022 Discharge date: 11/02/2022 Admitted From: Home Disposition: Home Recommendations for Outpatient Follow-up:  Follow up with PCP in as below In show partner treatment for trichomoniasis Blood pressure elevated during this hospitalization.  Reassess blood pressure and consider treatment if elevated CMP and CBC at follow-up Please follow up on the following pending results: Hemoglobin A1c  Home Health: Not indicated Equipment/Devices: Not indicated  Discharge Condition: Stable CODE STATUS: Full code  Follow-up Information     [facility] Follow up on 11/15/2022.   Specialty: Family Medicine Why: November 15, 2022 1050 am. If for any reason you need to reschedule please call at least one day in advance They have fiancial counselling nad a pharmacy( [facility]) Contact information: [address] [place] [id number] [phone]                Hospital course 42 year old F with no significant PMH presenting with aphasia, headache, slurred speech and confusion for 1 day and admitted for CVA/TIA workup.  Neurology consulted on admission.  Patient was outside tPA window on arrival to ED.  CT head, MRI brain, MRA head, TTE and carotid ultrasound without significant finding.  LDL 104.  UDS positive for marijuana.  UA positive for trichomoniasis.  Patient was started on aspirin, Plavix and Lipitor.  Also started on p.o. Flagyl for trichomoniasis.  The next day, symptoms resolved.  Evaluated by neurology and felt to be migraine headache.  Cleared for discharge by neurology on low-dose aspirin.  Also gave prescription for Lipitor 20 mg daily for elevated LDL.  Hemoglobin A1c pending at time of discharge.  Of note, blood pressure was elevated.  Reassess BP at follow-up and initiate  treatment if appropriate.  Patient has been counseled on the importance of partner treatment for trichomoniasis and using protection such as condoms until both parties are fully treated.  Hypokalemia replenished prior to discharge.  See individual problem list below for more.   Problems addressed during this hospitalization Principal Problem:   TIA (transient ischemic attack) Active Problems:   Trichomoniasis   Hypokalemia   Marijuana use              Time spent 35 minutes  Vital signs Vitals:   11/01/22 2353 11/02/22 0025 11/02/22 0356 11/02/22 1113  BP: (!) 147/92  (!) 157/100 (!) 160/95  Pulse: 80  78 71  Temp: 98.2 F (36.8 C)  98.3 F (36.8 C) 98.3 F (36.8 C)  Resp:   18 19  Height:      Weight:      SpO2: 100% 98% 98% 100%  TempSrc: Oral  Oral Oral  BMI (Calculated):         Discharge exam  GENERAL: No apparent distress.  Nontoxic. HEENT: MMM.  Vision and hearing grossly intact.  NECK: Supple.  No apparent JVD.  RESP:  No IWOB.  Fair aeration bilaterally. CVS:  RRR. Heart sounds normal.  ABD/GI/GU: BS+. Abd soft, NTND.  MSK/EXT:  Moves extremities. No apparent deformity. No edema.  SKIN: no apparent skin lesion or wound NEURO: Awake, alert and oriented appropriately. Speech clear. Cranial nerves II-XII  intact. Motor 5/5 in all muscle groups of UE and LE bilaterally, Normal tone. Light sensation intact in all dermatomes of upper and lower ext bilaterally. Patellar reflex symmetric.  No pronator drift.  Finger to  nose intact. PSYCH: Calm. Normal affect.   Discharge Instructions Discharge Instructions     Diet - low sodium heart healthy   Complete by: As directed    Discharge instructions   Complete by: As directed    It has been a pleasure taking care of you!  You were hospitalized due to strokelike symptoms.  However, your MRI is negative for stroke.  Your symptoms could be due to migraine headache.  We have started you on cholesterol medication  and baby dose aspirin to reduce your risk of stroke in the future. Your blood pressure is elevated.  Would recommend to follow-up with your primary care doctor to have your blood pressure rechecked in 1-2 weeks.  Review all your new medication list and the directions on your medications before you take them.      Take care,   Increase activity slowly   Complete by: As directed       Allergies as of 11/02/2022       Reactions   Other Anaphylaxis   Anaphylactic reaction to pickles only - pt is fine with cucumbers        Medication List     TAKE these medications    albuterol 108 (90 Base) MCG/ACT inhaler Commonly known as: VENTOLIN HFA Inhale 2 puffs into the lungs every 4 (four) hours as needed for wheezing or shortness of breath.   aspirin EC 81 MG tablet Take 1 tablet (81 mg total) by mouth daily. Swallow whole. Start taking on: Nov 03, 2022   atorvastatin 20 MG tablet Commonly known as: LIPITOR Take 1 tablet (20 mg total) by mouth daily. Start taking on: Nov 03, 2022   etonogestrel 68 MG Impl implant Commonly known as: NEXPLANON 1 each by Subdermal route once.   metroNIDAZOLE 500 MG tablet Commonly known as: FLAGYL Take 1 tablet (500 mg total) by mouth 2 (two) times daily. Do not drink alcohol while taking Flagyl.        Consultations: Neurology  Procedures/Studies:   VAS US CAROTID  Result Date: 11/02/2022 Carotid Arterial Duplex Study Patient Name:  CEDRA ETSITTY  Date of Exam:   11/02/2022 Medical Rec #: [id number]      Accession #:    [id number] Date of Birth: 04-22-1981      Patient Gender: F Patient Age:   42 years Exam Location:  [facility] Procedure:      VAS US CAROTID Referring Phys: Dow Adolph --------------------------------------------------------------------------------  Indications:       Speech disturbance and Headache. Hypertensive emergency. Comparison Study:  No prior study Performing Technologist: Sherren Kerns RVS   Examination Guidelines: A complete evaluation includes B-mode imaging, spectral Doppler, color Doppler, and power Doppler as needed of all accessible portions of each vessel. Bilateral testing is considered an integral part of a complete examination. Limited examinations for reoccurring indications may be performed as noted.  Right Carotid Findings: +----------+--------+--------+--------+------------------+------------------+           PSV cm/sEDV cm/sStenosisPlaque DescriptionComments           +----------+--------+--------+--------+------------------+------------------+ CCA Prox  112     29                                intimal thickening +----------+--------+--------+--------+------------------+------------------+ CCA Distal83      27  intimal thickening +----------+--------+--------+--------+------------------+------------------+ ICA Prox  58      21              heterogenous                         +----------+--------+--------+--------+------------------+------------------+ ICA Mid   61      27                                                   +----------+--------+--------+--------+------------------+------------------+ ICA Distal59      28                                                   +----------+--------+--------+--------+------------------+------------------+ ECA       83      11                                                   +----------+--------+--------+--------+------------------+------------------+ +----------+--------+-------+--------+-------------------+           PSV cm/sEDV cmsDescribeArm Pressure (mmHG) +----------+--------+-------+--------+-------------------+ [id number]                                        +----------+--------+-------+--------+-------------------+ +---------+--------+--+--------+--+ VertebralPSV cm/s41EDV cm/s17 +---------+--------+--+--------+--+  Left Carotid Findings:  +----------+--------+--------+--------+------------------+------------------+           PSV cm/sEDV cm/sStenosisPlaque DescriptionComments           +----------+--------+--------+--------+------------------+------------------+ CCA Prox  101     24                                intimal thickening +----------+--------+--------+--------+------------------+------------------+ CCA Distal83      27                                intimal thickening +----------+--------+--------+--------+------------------+------------------+ ICA Prox  89      40              heterogenous                         +----------+--------+--------+--------+------------------+------------------+ ICA Mid   72      28                                                   +----------+--------+--------+--------+------------------+------------------+ ICA Distal105     35                                                   +----------+--------+--------+--------+------------------+------------------+ ECA       65      9                                                    +----------+--------+--------+--------+------------------+------------------+ +----------+--------+--------+--------+-------------------+  PSV cm/sEDV cm/sDescribeArm Pressure (mmHG) +----------+--------+--------+--------+-------------------+ [id number]                                         +----------+--------+--------+--------+-------------------+ +---------+--------+--+--------+--+ VertebralPSV cm/s53EDV cm/s15 +---------+--------+--+--------+--+   Summary: Right Carotid: The extracranial vessels were near-normal with only minimal wall                thickening or plaque. Left Carotid: The extracranial vessels were near-normal with only minimal wall               thickening or plaque. Vertebrals:  Bilateral vertebral arteries demonstrate antegrade flow. Subclavians: Normal flow hemodynamics were seen in  bilateral subclavian              arteries. *See table(s) above for measurements and observations.     Preliminary    ECHOCARDIOGRAM COMPLETE BUBBLE STUDY  Result Date: 11/02/2022    ECHOCARDIOGRAM REPORT   Patient Name:   BERTHENIA CROSIER Date of Exam: 11/02/2022 Medical Rec #:  [id number]     Height:       64.0 in Accession #:    [id number]    Weight:       170.0 lb Date of Birth:  02-16-1981     BSA:          1.826 m Patient Age:    41 years      BP:           157/100 mmHg Patient Gender: F             HR:           74 bpm. Exam Location:  Inpatient Procedure: 2D Echo, Cardiac Doppler, Color Doppler and Saline Contrast Bubble            Study Indications:    TIA  History:        Patient has no prior history of Echocardiogram examinations.                 Signs/Symptoms:Fever.  Sonographer:    Delcie Roch RDCS Referring Phys: [id number] Mikaylah Libbey T Rosmarie Esquibel IMPRESSIONS  1. Left ventricular ejection fraction, by estimation, is 65 to 70%. The left ventricle has normal function. The left ventricle has no regional wall motion abnormalities. Left ventricular diastolic parameters were normal.  2. Right ventricular systolic function is normal. The right ventricular size is normal. Tricuspid regurgitation signal is inadequate for assessing PA pressure.  3. The mitral valve is normal in structure. Trivial mitral valve regurgitation. No evidence of mitral stenosis.  4. The aortic valve is tricuspid. Aortic valve regurgitation is trivial. No aortic stenosis is present.  5. The inferior vena cava is normal in size with greater than 50% respiratory variability, suggesting right atrial pressure of 3 mmHg.  6. Bubble study negative, no evidence for PFO/ASD. FINDINGS  Left Ventricle: Left ventricular ejection fraction, by estimation, is 65 to 70%. The left ventricle has normal function. The left ventricle has no regional wall motion abnormalities. The left ventricular internal cavity size was normal in size. There is  no left  ventricular hypertrophy. Left ventricular diastolic parameters were normal. Right Ventricle: The right ventricular size is normal. No increase in right ventricular wall thickness. Right ventricular systolic function is normal. Tricuspid regurgitation signal is inadequate for assessing PA pressure. Left Atrium: Left atrial size was normal in size. Right Atrium: Right atrial size was normal in size.  Pericardium: Trivial pericardial effusion is present. Mitral Valve: The mitral valve is normal in structure. Trivial mitral valve regurgitation. No evidence of mitral valve stenosis. Tricuspid Valve: The tricuspid valve is normal in structure. Tricuspid valve regurgitation is trivial. Aortic Valve: The aortic valve is tricuspid. Aortic valve regurgitation is trivial. Aortic regurgitation PHT measures 579 msec. No aortic stenosis is present. Pulmonic Valve: The pulmonic valve was normal in structure. Pulmonic valve regurgitation is not visualized. Aorta: The aortic root is normal in size and structure. Venous: The inferior vena cava is normal in size with greater than 50% respiratory variability, suggesting right atrial pressure of 3 mmHg. IAS/Shunts: Bubble study negative, no evidence for PFO/ASD. Agitated saline contrast was given intravenously to evaluate for intracardiac shunting.  LEFT VENTRICLE PLAX 2D LVIDd:         4.30 cm   Diastology LVIDs:         2.20 cm   LV e' medial:    10.60 cm/s LV PW:         1.00 cm   LV E/e' medial:  9.7 LV IVS:        1.10 cm   LV e' lateral:   12.10 cm/s LVOT diam:     2.00 cm   LV E/e' lateral: 8.5 LV SV:         57 LV SV Index:   31 LVOT Area:     3.14 cm  RIGHT VENTRICLE             IVC RV S prime:     11.30 cm/s  IVC diam: 1.30 cm TAPSE (M-mode): 2.6 cm LEFT ATRIUM             Index        RIGHT ATRIUM           Index LA diam:        2.90 cm 1.59 cm/m   RA Area:     11.20 cm LA Vol (A2C):   45.1 ml 24.70 ml/m  RA Volume:   23.10 ml  12.65 ml/m LA Vol (A4C):   39.3 ml 21.52  ml/m LA Biplane Vol: 45.5 ml 24.92 ml/m  AORTIC VALVE LVOT Vmax:   93.30 cm/s LVOT Vmean:  59.500 cm/s LVOT VTI:    0.183 m AI PHT:      579 msec  AORTA Ao Root diam: 3.00 cm Ao Asc diam:  2.50 cm MITRAL VALVE MV Area (PHT): 5.54 cm     SHUNTS MV Decel Time: 137 msec     Systemic VTI:  0.18 m MV E velocity: 103.00 cm/s  Systemic Diam: 2.00 cm MV A velocity: 52.20 cm/s MV E/A ratio:  1.97 Dalton McleanMD Electronically signed by Wilfred Lacy Signature Date/Time: 11/02/2022/9:13:52 AM    Final    MR BRAIN WO CONTRAST  Result Date: 11/02/2022 CLINICAL DATA:  Transient ischemic attack EXAM: MRI HEAD WITHOUT CONTRAST MRA HEAD WITHOUT CONTRAST TECHNIQUE: Multiplanar, multi-echo pulse sequences of the brain and surrounding structures were acquired without intravenous contrast. Angiographic images of the Circle of Willis were acquired using MRA technique without intravenous contrast. COMPARISON:  None Available. FINDINGS: MRI HEAD FINDINGS Brain: No acute infarct, mass effect or extra-axial collection. No acute or chronic hemorrhage. Normal white matter signal, parenchymal volume and CSF spaces. The midline structures are normal. Vascular: Major flow voids are preserved. Skull and upper cervical spine: Normal calvarium and skull base. Visualized upper cervical spine and soft tissues are normal. Sinuses/Orbits:No paranasal sinus fluid  levels or advanced mucosal thickening. No mastoid or middle ear effusion. Normal orbits. MRA HEAD FINDINGS POSTERIOR CIRCULATION: --Vertebral arteries: Normal --Inferior cerebellar arteries: Normal. --Basilar artery: Normal. --Superior cerebellar arteries: Normal. --Posterior cerebral arteries: Normal. ANTERIOR CIRCULATION: --Intracranial internal carotid arteries: Normal. --Anterior cerebral arteries (ACA): Normal. --Middle cerebral arteries (MCA): Normal. IMPRESSION: Normal MRI and MRA of the brain. Electronically Signed   By: Deatra Robinson M.D.   On: 11/02/2022 02:21   MR ANGIO  HEAD WO CONTRAST  Result Date: 11/02/2022 CLINICAL DATA:  Transient ischemic attack EXAM: MRI HEAD WITHOUT CONTRAST MRA HEAD WITHOUT CONTRAST TECHNIQUE: Multiplanar, multi-echo pulse sequences of the brain and surrounding structures were acquired without intravenous contrast. Angiographic images of the Circle of Willis were acquired using MRA technique without intravenous contrast. COMPARISON:  None Available. FINDINGS: MRI HEAD FINDINGS Brain: No acute infarct, mass effect or extra-axial collection. No acute or chronic hemorrhage. Normal white matter signal, parenchymal volume and CSF spaces. The midline structures are normal. Vascular: Major flow voids are preserved. Skull and upper cervical spine: Normal calvarium and skull base. Visualized upper cervical spine and soft tissues are normal. Sinuses/Orbits:No paranasal sinus fluid levels or advanced mucosal thickening. No mastoid or middle ear effusion. Normal orbits. MRA HEAD FINDINGS POSTERIOR CIRCULATION: --Vertebral arteries: Normal --Inferior cerebellar arteries: Normal. --Basilar artery: Normal. --Superior cerebellar arteries: Normal. --Posterior cerebral arteries: Normal. ANTERIOR CIRCULATION: --Intracranial internal carotid arteries: Normal. --Anterior cerebral arteries (ACA): Normal. --Middle cerebral arteries (MCA): Normal. IMPRESSION: Normal MRI and MRA of the brain. Electronically Signed   By: Deatra Robinson M.D.   On: 11/02/2022 02:21   CT Head Wo Contrast  Result Date: 11/01/2022 CLINICAL DATA:  Headache, increasing frequency or severity EXAM: CT HEAD WITHOUT CONTRAST TECHNIQUE: Contiguous axial images were obtained from the base of the skull through the vertex without intravenous contrast. RADIATION DOSE REDUCTION: This exam was performed according to the departmental dose-optimization program which includes automated exposure control, adjustment of the mA and/or kV according to patient size and/or use of iterative reconstruction technique.  COMPARISON:  04/11/2020 FINDINGS: Brain: No acute infarct or hemorrhage. Lateral ventricles and midline structures are unremarkable. No acute extra-axial fluid collections. No mass effect. Vascular: No hyperdense vessel or unexpected calcification. Skull: Normal. Negative for fracture or focal lesion. Sinuses/Orbits: No acute finding. Other: None. IMPRESSION: 1. No acute intracranial process. Electronically Signed   By: Sharlet Salina M.D.   On: 11/01/2022 18:57       The results of significant diagnostics from this hospitalization (including imaging, microbiology, ancillary and laboratory) are listed below for reference.     Microbiology: No results found for this or any previous visit (from the past 240 hour(s)).   Labs:  CBC: Recent Labs  Lab 11/01/22 1857 11/02/22 0456  WBC 8.2 6.7  NEUTROABS 4.2  --   HGB 12.5 11.8*  HCT 38.4 36.1  MCV 85.7 85.1  PLT 300 273   BMP &GFR Recent Labs  Lab 11/01/22 1857 11/02/22 0456  NA 136 135  K 3.6 3.4*  CL 103 104  CO2 24 24  GLUCOSE 106* 112*  BUN 11 9  CREATININE 0.74 0.73  CALCIUM 8.8* 8.2*   Estimated Creatinine Clearance: 93.1 mL/min (by C-G formula based on SCr of 0.73 mg/dL). Liver & Pancreas: Recent Labs  Lab 11/01/22 1857  AST 22  ALT 20  ALKPHOS 63  BILITOT 0.5  PROT 7.5  ALBUMIN 4.1   No results for input(s): "LIPASE", "AMYLASE" in the last 168 hours. No results  for input(s): "AMMONIA" in the last 168 hours. Diabetic: No results for input(s): "HGBA1C" in the last 72 hours. Recent Labs  Lab 11/01/22 1937  GLUCAP 115*   Cardiac Enzymes: No results for input(s): "CKTOTAL", "CKMB", "CKMBINDEX", "TROPONINI" in the last 168 hours. No results for input(s): "PROBNP" in the last 8760 hours. Coagulation Profile: No results for input(s): "INR", "PROTIME" in the last 168 hours. Thyroid Function Tests: Recent Labs    11/02/22 0456  TSH 2.888   Lipid Profile: Recent Labs    11/02/22 0456  CHOL 158  HDL 41   LDLCALC 104*  TRIG 65  CHOLHDL 3.9   Anemia Panel: No results for input(s): "VITAMINB12", "FOLATE", "FERRITIN", "TIBC", "IRON", "RETICCTPCT" in the last 72 hours. Urine analysis:    Component Value Date/Time   COLORURINE YELLOW 11/01/2022 2205   APPEARANCEUR CLEAR 11/01/2022 2205   LABSPEC <=1.005 11/01/2022 2205   PHURINE 5.5 11/01/2022 2205   GLUCOSEU NEGATIVE 11/01/2022 2205   HGBUR NEGATIVE 11/01/2022 2205   BILIRUBINUR NEGATIVE 11/01/2022 2205   KETONESUR NEGATIVE 11/01/2022 2205   PROTEINUR NEGATIVE 11/01/2022 2205   NITRITE NEGATIVE 11/01/2022 2205   LEUKOCYTESUR TRACE (A) 11/01/2022 2205   Sepsis Labs: Invalid input(s): "PROCALCITONIN", "LACTICIDVEN"   SIGNED:  Almon Hercules, MD  [facility] 11/02/2022, 3:57 PM

## 2022-11-03 LAB — HEMOGLOBIN A1C
Hgb A1c MFr Bld: 6 % — ABNORMAL HIGH (ref 4.8–5.6)
Mean Plasma Glucose: 126 mg/dL

## 2022-11-14 ENCOUNTER — Telehealth (INDEPENDENT_AMBULATORY_CARE_PROVIDER_SITE_OTHER): Payer: Self-pay | Admitting: Primary Care

## 2022-11-14 NOTE — Telephone Encounter (Signed)
Unable to leave VM

## 2022-11-15 ENCOUNTER — Inpatient Hospital Stay (INDEPENDENT_AMBULATORY_CARE_PROVIDER_SITE_OTHER): Payer: Medicaid Other | Admitting: Primary Care

## 2023-06-14 ENCOUNTER — Ambulatory Visit
Admission: EM | Admit: 2023-06-14 | Discharge: 2023-06-14 | Disposition: A | Discharge status: Home / Self Care | Payer: Medicaid Other | Attending: Physician Assistant | Admitting: Physician Assistant

## 2023-06-14 DIAGNOSIS — J029 Acute pharyngitis, unspecified: Secondary | ICD-10-CM

## 2023-06-14 LAB — POCT RAPID STREP A (OFFICE): Rapid Strep A Screen: NEGATIVE

## 2023-06-14 NOTE — Discharge Instructions (Signed)
Return if any problems. See your Physician for recheck if symptoms persist  

## 2023-06-14 NOTE — ED Triage Notes (Signed)
 Patient presents with sore throat and cough x day 5. Treated with Robitussin, mucinex and warm broth without relief.

## 2023-06-16 NOTE — ED Provider Notes (Signed)
 [facility]    CSN: [id number] Arrival date & time: 06/14/23  0920      History   Chief Complaint No chief complaint on file.   HPI Marcia Soto is a 43 y.o. female.   Pt complains of a sore throat.  Pt reports she has had a sore throat x 5 days   The history is provided by the patient. No language interpreter was used.    History reviewed. No pertinent past medical history.  Patient Active Problem List   Diagnosis Date Noted   Trichomoniasis 11/02/2022   Hypokalemia 11/02/2022   Marijuana use 11/02/2022   TIA (transient ischemic attack) 11/01/2022   Bacterial infection of finger or toe 02/11/2018   Human bite    Infectious tenosynovitis     Past Surgical History:  Procedure Laterality Date   I & D EXTREMITY Bilateral 02/11/2018   Procedure: IRRIGATION AND DEBRIDEMENT BILATERAL /MIDDLE FINGERS;  Surgeon: Vernetta Lonni GRADE, MD;  Location: [facility];  Service: Orthopedics;  Laterality: Bilateral;    OB History   No obstetric history on file.      Home Medications    Prior to Admission medications   Medication Sig Start Date End Date Taking? Authorizing Provider  aspirin  EC 81 MG tablet Take 1 tablet (81 mg total) by mouth daily. Swallow whole. 11/03/22   Gonfa, Taye T, MD  atorvastatin  (LIPITOR ) 20 MG tablet Take 1 tablet (20 mg total) by mouth daily. 11/03/22   Gonfa, Taye T, MD  etonogestrel (NEXPLANON) 68 MG IMPL implant 1 each by Subdermal route once.    [provider]  metroNIDAZOLE  (FLAGYL ) 500 MG tablet Take 1 tablet (500 mg total) by mouth 2 (two) times daily. Do not drink alcohol while taking Flagyl . 11/02/22   Kathrin Mignon DASEN, MD    Family History Family History  Family history unknown: Yes    Social History Social History   Tobacco Use   Smoking status: Never   Smokeless tobacco: Never  Substance Use Topics   Alcohol use: Yes    Comment: occassional   Drug use: Not Currently     Allergies   Other   Review of  Systems Review of Systems  HENT:  Positive for sore throat.   All other systems reviewed and are negative.    Physical Exam Triage Vital Signs ED Triage Vitals  Encounter Vitals Group     BP 06/14/23 0957 (!) 147/97     Systolic BP Percentile --      Diastolic BP Percentile --      Pulse Rate 06/14/23 0957 86     Resp 06/14/23 0957 16     Temp 06/14/23 0957 99 F (37.2 C)     Temp Source 06/14/23 0957 Oral     SpO2 06/14/23 0957 98 %     Weight 06/14/23 0956 177 lb (80.3 kg)     Height 06/14/23 0956 5' (1.524 m)     Head Circumference --      Peak Flow --      Pain Score 06/14/23 0956 10     Pain Loc --      Pain Education --      Exclude from Growth Chart --    No data found.  Updated Vital Signs BP (!) 147/97 Comment: did not take bp meds since Monday  Pulse 86   Temp 99 F (37.2 C) (Oral)   Resp 16   Ht 5' (1.524 m)   [employer]  80.3 kg   LMP 05/29/2023 (Exact Date)   SpO2 98%   BMI 34.57 kg/m   Visual Acuity Right Eye Distance:   Left Eye Distance:   Bilateral Distance:    Right Eye Near:   Left Eye Near:    Bilateral Near:     Physical Exam Vitals and nursing note reviewed.  Constitutional:      Appearance: She is well-developed.  HENT:     Head: Normocephalic.     Right Ear: Tympanic membrane normal.     Left Ear: Tympanic membrane normal.     Mouth/Throat:     Mouth: Mucous membranes are moist.     Pharynx: Posterior oropharyngeal erythema present.  Eyes:     Pupils: Pupils are equal, round, and reactive to light.  Cardiovascular:     Rate and Rhythm: Normal rate.  Pulmonary:     Effort: Pulmonary effort is normal.  Abdominal:     General: There is no distension.  Musculoskeletal:        General: Normal range of motion.     Cervical back: Normal range of motion.  Neurological:     Mental Status: She is alert and oriented to person, place, and time.      UC Treatments / Results  Labs (all labs ordered are listed, but only abnormal  results are displayed) Labs Reviewed  POCT RAPID STREP A (OFFICE)    EKG   Radiology No results found.  Procedures Procedures (including critical care time)  Medications Ordered in UC Medications - No data to display  Initial Impression / Assessment and Plan / UC Course  I have reviewed the triage vital signs and the nursing notes.  Pertinent labs & imaging results that were available during my care of the patient were reviewed by me and considered in my medical decision making (see chart for details).    POCT  strep negative  Final Clinical Impressions(s) / UC Diagnoses   Final diagnoses:  Viral pharyngitis     Discharge Instructions      Return if any problems.  See your Physician for recheck if symptoms persist    ED Prescriptions   None    PDMP not reviewed this encounter.   Flint Sonny POUR, PA-C 06/16/23 1028

## 2024-02-14 ENCOUNTER — Telehealth (HOSPITAL_COMMUNITY): Payer: Self-pay | Admitting: *Deleted

## 2024-02-14 NOTE — Telephone Encounter (Signed)
 Opened in error
